# Patient Record
Sex: Female | Born: 1955 | Race: White | Hispanic: No | Marital: Married | Smoking: Never smoker
Health system: Southern US, Community
[De-identification: ages and names within clinical notes are randomized; demographics above are authoritative.]

## PROBLEM LIST (undated history)

## (undated) DIAGNOSIS — E079 Disorder of thyroid, unspecified: Secondary | ICD-10-CM

## (undated) DIAGNOSIS — R51 Headache: Secondary | ICD-10-CM

## (undated) DIAGNOSIS — D126 Benign neoplasm of colon, unspecified: Secondary | ICD-10-CM

## (undated) DIAGNOSIS — K649 Unspecified hemorrhoids: Secondary | ICD-10-CM

## (undated) DIAGNOSIS — F419 Anxiety disorder, unspecified: Secondary | ICD-10-CM

## (undated) DIAGNOSIS — H269 Unspecified cataract: Secondary | ICD-10-CM

## (undated) DIAGNOSIS — K589 Irritable bowel syndrome without diarrhea: Secondary | ICD-10-CM

## (undated) DIAGNOSIS — D649 Anemia, unspecified: Secondary | ICD-10-CM

## (undated) DIAGNOSIS — K449 Diaphragmatic hernia without obstruction or gangrene: Secondary | ICD-10-CM

## (undated) DIAGNOSIS — T7840XA Allergy, unspecified, initial encounter: Secondary | ICD-10-CM

## (undated) DIAGNOSIS — F329 Major depressive disorder, single episode, unspecified: Secondary | ICD-10-CM

## (undated) DIAGNOSIS — F32A Depression, unspecified: Secondary | ICD-10-CM

## (undated) DIAGNOSIS — M810 Age-related osteoporosis without current pathological fracture: Secondary | ICD-10-CM

## (undated) DIAGNOSIS — K648 Other hemorrhoids: Secondary | ICD-10-CM

## (undated) HISTORY — DX: Diaphragmatic hernia without obstruction or gangrene: K44.9

## (undated) HISTORY — DX: Benign neoplasm of colon, unspecified: D12.6

## (undated) HISTORY — DX: Depression, unspecified: F32.A

## (undated) HISTORY — PX: TONSILLECTOMY: SUR1361

## (undated) HISTORY — DX: Anxiety disorder, unspecified: F41.9

## (undated) HISTORY — DX: Unspecified cataract: H26.9

## (undated) HISTORY — PX: INGUINAL HERNIA REPAIR: SUR1180

## (undated) HISTORY — DX: Anemia, unspecified: D64.9

## (undated) HISTORY — DX: Unspecified hemorrhoids: K64.9

## (undated) HISTORY — DX: Major depressive disorder, single episode, unspecified: F32.9

## (undated) HISTORY — DX: Allergy, unspecified, initial encounter: T78.40XA

## (undated) HISTORY — DX: Irritable bowel syndrome, unspecified: K58.9

## (undated) HISTORY — DX: Disorder of thyroid, unspecified: E07.9

## (undated) HISTORY — PX: OTHER SURGICAL HISTORY: SHX169

## (undated) HISTORY — DX: Other hemorrhoids: K64.8

## (undated) HISTORY — PX: EYE SURGERY: SHX253

## (undated) HISTORY — DX: Age-related osteoporosis without current pathological fracture: M81.0

## (undated) HISTORY — PX: ABDOMINAL HYSTERECTOMY: SHX81

---

## 1998-02-06 ENCOUNTER — Ambulatory Visit (HOSPITAL_COMMUNITY): Admission: RE | Admit: 1998-02-06 | Discharge: 1998-02-06 | Payer: Self-pay | Admitting: Obstetrics & Gynecology

## 1998-10-16 ENCOUNTER — Other Ambulatory Visit: Admission: RE | Admit: 1998-10-16 | Discharge: 1998-10-16 | Payer: Self-pay | Admitting: Obstetrics & Gynecology

## 1999-01-01 ENCOUNTER — Ambulatory Visit (HOSPITAL_COMMUNITY): Admission: RE | Admit: 1999-01-01 | Discharge: 1999-01-01 | Payer: Self-pay | Admitting: Obstetrics & Gynecology

## 1999-01-01 ENCOUNTER — Encounter: Payer: Self-pay | Admitting: Obstetrics & Gynecology

## 1999-07-29 ENCOUNTER — Encounter: Payer: Self-pay | Admitting: Orthopedic Surgery

## 1999-07-29 ENCOUNTER — Ambulatory Visit (HOSPITAL_COMMUNITY): Admission: RE | Admit: 1999-07-29 | Discharge: 1999-07-29 | Payer: Self-pay | Admitting: Orthopedic Surgery

## 1999-10-29 ENCOUNTER — Other Ambulatory Visit: Admission: RE | Admit: 1999-10-29 | Discharge: 1999-10-29 | Payer: Self-pay | Admitting: Obstetrics & Gynecology

## 2000-01-03 ENCOUNTER — Encounter: Payer: Self-pay | Admitting: Obstetrics & Gynecology

## 2000-01-03 ENCOUNTER — Ambulatory Visit (HOSPITAL_COMMUNITY): Admission: RE | Admit: 2000-01-03 | Discharge: 2000-01-03 | Payer: Self-pay | Admitting: Obstetrics & Gynecology

## 2000-11-18 ENCOUNTER — Other Ambulatory Visit: Admission: RE | Admit: 2000-11-18 | Discharge: 2000-11-18 | Payer: Self-pay | Admitting: Obstetrics & Gynecology

## 2001-01-05 ENCOUNTER — Ambulatory Visit (HOSPITAL_COMMUNITY): Admission: RE | Admit: 2001-01-05 | Discharge: 2001-01-05 | Payer: Self-pay | Admitting: Obstetrics & Gynecology

## 2001-01-05 ENCOUNTER — Encounter: Payer: Self-pay | Admitting: Obstetrics & Gynecology

## 2001-12-10 ENCOUNTER — Other Ambulatory Visit: Admission: RE | Admit: 2001-12-10 | Discharge: 2001-12-10 | Payer: Self-pay | Admitting: Obstetrics & Gynecology

## 2002-01-17 ENCOUNTER — Encounter: Payer: Self-pay | Admitting: Obstetrics & Gynecology

## 2002-01-17 ENCOUNTER — Ambulatory Visit (HOSPITAL_COMMUNITY): Admission: RE | Admit: 2002-01-17 | Discharge: 2002-01-17 | Payer: Self-pay | Admitting: Obstetrics & Gynecology

## 2002-12-15 ENCOUNTER — Other Ambulatory Visit: Admission: RE | Admit: 2002-12-15 | Discharge: 2002-12-15 | Payer: Self-pay | Admitting: Obstetrics & Gynecology

## 2003-10-20 ENCOUNTER — Ambulatory Visit (HOSPITAL_COMMUNITY): Admission: RE | Admit: 2003-10-20 | Discharge: 2003-10-20 | Payer: Self-pay | Admitting: Cardiology

## 2004-01-31 ENCOUNTER — Other Ambulatory Visit: Admission: RE | Admit: 2004-01-31 | Discharge: 2004-01-31 | Payer: Self-pay | Admitting: Obstetrics & Gynecology

## 2005-02-18 ENCOUNTER — Other Ambulatory Visit: Admission: RE | Admit: 2005-02-18 | Discharge: 2005-02-18 | Payer: Self-pay | Admitting: Obstetrics & Gynecology

## 2007-11-05 ENCOUNTER — Ambulatory Visit: Payer: Self-pay | Admitting: Gastroenterology

## 2007-11-15 ENCOUNTER — Ambulatory Visit: Payer: Self-pay | Admitting: Gastroenterology

## 2007-11-15 DIAGNOSIS — K649 Unspecified hemorrhoids: Secondary | ICD-10-CM

## 2007-11-15 HISTORY — DX: Unspecified hemorrhoids: K64.9

## 2007-11-15 HISTORY — PX: COLONOSCOPY: SHX174

## 2007-12-09 ENCOUNTER — Encounter (INDEPENDENT_AMBULATORY_CARE_PROVIDER_SITE_OTHER): Payer: Self-pay | Admitting: Diagnostic Radiology

## 2007-12-09 ENCOUNTER — Encounter: Admission: RE | Admit: 2007-12-09 | Discharge: 2007-12-09 | Payer: Self-pay | Admitting: Obstetrics & Gynecology

## 2012-10-19 ENCOUNTER — Encounter (INDEPENDENT_AMBULATORY_CARE_PROVIDER_SITE_OTHER): Payer: Self-pay | Admitting: Surgery

## 2012-10-20 ENCOUNTER — Ambulatory Visit (INDEPENDENT_AMBULATORY_CARE_PROVIDER_SITE_OTHER): Payer: BC Managed Care – PPO | Admitting: Surgery

## 2012-10-20 ENCOUNTER — Encounter (INDEPENDENT_AMBULATORY_CARE_PROVIDER_SITE_OTHER): Payer: Self-pay | Admitting: Surgery

## 2012-10-20 VITALS — BP 108/68 | HR 64 | Temp 96.8°F | Resp 18 | Ht 61.0 in | Wt 149.0 lb

## 2012-10-20 DIAGNOSIS — K4090 Unilateral inguinal hernia, without obstruction or gangrene, not specified as recurrent: Secondary | ICD-10-CM | POA: Insufficient documentation

## 2012-10-20 NOTE — Progress Notes (Signed)
General Surgery [facility]  Chief Complaint  Patient presents with  . New Evaluation    eval poss hernia - referral from Dr. Konrad Dolores    HISTORY: The patient is a 57 year old white female referred by her gynecologist with left inguinal hernia. Patient had first noted symptoms approximately 3 weeks ago. She describes a dull ache in the left groin. She has noted mild soft tissue swelling in the region. She has performed manual reduction of a small hernia on occasion. She denies any signs or symptoms of obstruction. She has had no prior hernia repairs. She has undergone a laparoscopic assisted hysterectomy.she was evaluated by her gynecologist in late January 2014. She is now referred for repair of left inguinal hernia.  Past Medical History  Diagnosis Date  . Thyroid disease      Current Outpatient Prescriptions  Medication Sig Dispense Refill  . estradiol (VIVELLE-DOT) 0.05 MG/24HR Place 1 patch onto the skin once a week.      . levothyroxine (LEVOXYL) 25 MCG tablet Take 25 mcg by mouth daily.         Allergies  Allergen Reactions  . Benadryl (Diphenhydramine Hcl)   . Codeine      Family History  Problem Relation Age of Onset  . Cancer Mother      History   Social History  . Marital Status: Married    Spouse Name: N/A    Number of Children: N/A  . Years of Education: N/A   Social History Main Topics  . Smoking status: Never Smoker   . Smokeless tobacco: None  . Alcohol Use: Yes  . Drug Use: No  . Sexually Active:    Other Topics Concern  . None   Social History Narrative  . None     REVIEW OF SYSTEMS - PERTINENT POSITIVES ONLY: Denies signs or symptoms of obstruction. Always reducible.  EXAM: Filed Vitals:   10/20/12 0902  BP: 108/68  Pulse: 64  Temp: 96.8 F (36 C)  Resp: 18    HEENT: normocephalic; pupils equal and reactive; sclerae clear; dentition good; mucous membranes moist NECK:  symmetric on extension; no palpable  anterior or posterior cervical lymphadenopathy; no supraclavicular masses; no tenderness CHEST: clear to auscultation bilaterally without rales, rhonchi, or wheezes CARDIAC: regular rate and rhythm without significant murmur; peripheral pulses are full ABDOMEN: soft without distension; bowel sounds present; no mass; no hepatosplenomegaly; well-healed incision at the umbilicus. GU:  Palpation in the inguinal region with Valsalva and sit up maneuver shows a small likely indirect inguinal hernia on the left. There is no sign of hernia on the right. There is no sign of femoral hernia. There is no lymphadenopathy. EXT:  non-tender without edema; no deformity NEURO: no gross focal deficits; no sign of tremor   LABORATORY RESULTS: See [facility] (CHL-Epic) for most recent results   RADIOLOGY RESULTS: See [facility] (CHL-Epic) for most recent results   IMPRESSION: Left inguinal hernia, likely indirect, reducible  PLAN: I discussed operative repair of the left inguinal hernia with mesh with the patient at length. We discussed restrictions on her activities following the procedure. We discussed the risk of recurrence and infection. She understands and wishes to proceed. We will make arrangements for surgery as an outpatient in the near future.  The risks and benefits of the procedure have been discussed at length with the patient.  The patient understands the proposed procedure, potential alternative treatments, and the course of recovery to be expected.  All of the patient's questions have been answered at this time.  The patient wishes to proceed with surgery.  Velora Heckler, MD, FACS General & Endocrine Surgery [facility]   Visit Diagnoses: 1. Inguinal hernia unilateral, non-recurrent, left     Primary Care Physician: Primus Bravo, NP

## 2012-10-20 NOTE — Patient Instructions (Signed)
[facility]  HERNIA REPAIR POST OP INSTRUCTIONS  Always review your discharge instruction sheet given to you by the facility where your surgery was performed.  1. A  prescription for pain medication may be given to you upon discharge.  Take your pain medication as prescribed.  If narcotic pain medicine is not needed, then you may take acetaminophen (Tylenol) or ibuprofen (Advil) as needed.  2. Take your usually prescribed medications unless otherwise directed.  3. If you need a refill on your pain medication, please contact your pharmacy.  They will contact our office to request authorization. Prescriptions will not be filled after 5 pm daily or on weekends.  4. You should follow a light diet the first 24 hours after arrival home, such as soup and crackers or toast.  Be sure to include plenty of fluids daily.  Resume your normal diet the day after surgery.  5. Most patients will experience some swelling and bruising around the surgical site.  Ice packs and reclining will help.  Swelling and bruising can take several days to resolve.   6. It is common to experience some constipation if taking pain medication after surgery.  Increasing fluid intake and taking a stool softener (such as Colace) will usually help or prevent this problem from occurring.  A mild laxative (Milk of Magnesia or Miralax) should be taken according to package directions if there are no bowel movements after 48 hours.  7. Unless discharge instructions indicate otherwise, you may remove your bandages 24-48 hours after surgery, and you may shower at that time.  You may have steri-strips (small skin tapes) in place directly over the incision.  These strips should be left on the skin for 7-10 days.  If your surgeon used skin glue on the incision, you may shower in 24 hours.  The glue will flake off over the next 2-3 weeks.  Any sutures or staples will be removed at the office during your follow-up  visit.  8. ACTIVITIES:  You may resume regular (light) daily activities beginning the next day-such as daily self-care, walking, climbing stairs-gradually increasing activities as tolerated.  You may have sexual intercourse when it is comfortable.  Refrain from any heavy lifting or straining until approved by your doctor.  You may drive when you are no longer taking prescription pain medication, you can comfortably wear a seatbelt, and you can safely maneuver your car and apply brakes.  9. You should see your doctor in the office for a follow-up appointment approximately 2-3 weeks after your surgery.  Make sure that you call for this appointment within a day or two after you arrive home to insure a convenient appointment time. 10.   WHEN TO CALL YOUR DOCTOR: 1. Fever greater than 101.0 2. Inability to urinate 3. Persistent nausea and/or vomiting 4. Extreme swelling or bruising 5. Continued bleeding from incision 6. Increased pain, redness, or drainage from the incision  The clinic staff is available to answer your questions during regular business hours.  Please don't hesitate to call and ask to speak to one of the nurses for clinical concerns.  If you have a medical emergency, go to the nearest emergency room or call 911.  A surgeon from [facility] is always on call for the hospital.   [facility] [address], [address], [place], [place]  [address]  [phone] ? [phone] ? FAX [phone]  [email or web address]   

## 2012-11-11 DIAGNOSIS — K4090 Unilateral inguinal hernia, without obstruction or gangrene, not specified as recurrent: Secondary | ICD-10-CM

## 2012-11-12 ENCOUNTER — Telehealth (INDEPENDENT_AMBULATORY_CARE_PROVIDER_SITE_OTHER): Payer: Self-pay

## 2012-11-12 ENCOUNTER — Other Ambulatory Visit (INDEPENDENT_AMBULATORY_CARE_PROVIDER_SITE_OTHER): Payer: Self-pay

## 2012-11-12 DIAGNOSIS — R1900 Intra-abdominal and pelvic swelling, mass and lump, unspecified site: Secondary | ICD-10-CM

## 2012-11-12 NOTE — Telephone Encounter (Signed)
Message copied by Joanette Gula on Fri Nov 12, 2012  9:37 AM ------      Message from: Velora Heckler      Created: Thu Nov 11, 2012 11:27 AM       Surgery 2/27 at [facility]            Dx:  [facility]            Proc:  [facility] with mesh            Surg:  Colen Darling:      During procedure I noted an intra-abdominal mass.  She will need a CT scan of the abdomen in a few weeks.  Remind me to have this set up.      Thanks,      tmg            Velora Heckler, MD, [facility]      Office: [phone]             ------

## 2012-11-12 NOTE — Telephone Encounter (Signed)
Patient scheduled for po f/u 11/26/12 @ 2:00 patient is aware .and this appt was put in this slot per [facility]

## 2012-11-19 ENCOUNTER — Telehealth (INDEPENDENT_AMBULATORY_CARE_PROVIDER_SITE_OTHER): Payer: Self-pay | Admitting: General Surgery

## 2012-11-19 NOTE — Telephone Encounter (Signed)
Pt called for reassurance; is one week out from [facility].  Her family is "overly protective" and want her to sit all the time.  Advised pt to walk frequently and avoid pushing, pulling, lifting or carrying anything over 10 lbs for the first two weeks, then can go up to 20 lbs restriction.  Also add NSAIDs to her pain control regimen and limit narcotics AMAP.  She agrees and understands.

## 2012-11-25 ENCOUNTER — Ambulatory Visit
Admission: RE | Admit: 2012-11-25 | Discharge: 2012-11-25 | Disposition: A | Discharge status: Home / Self Care | Payer: BC Managed Care – PPO | Source: Ambulatory Visit | Attending: Surgery | Admitting: Surgery

## 2012-11-25 DIAGNOSIS — R1900 Intra-abdominal and pelvic swelling, mass and lump, unspecified site: Secondary | ICD-10-CM

## 2012-11-25 MED ORDER — IOHEXOL 300 MG/ML  SOLN
100.0000 mL | Freq: Once | INTRAMUSCULAR | Status: AC | PRN
  Administered 2012-11-25: 100 mL via INTRAVENOUS

## 2012-11-26 ENCOUNTER — Ambulatory Visit (INDEPENDENT_AMBULATORY_CARE_PROVIDER_SITE_OTHER): Payer: BC Managed Care – PPO | Admitting: Surgery

## 2012-11-26 ENCOUNTER — Encounter (INDEPENDENT_AMBULATORY_CARE_PROVIDER_SITE_OTHER): Payer: Self-pay | Admitting: Surgery

## 2012-11-26 VITALS — BP 104/62 | HR 70 | Resp 18 | Ht 60.0 in | Wt 148.0 lb

## 2012-11-26 DIAGNOSIS — R1900 Intra-abdominal and pelvic swelling, mass and lump, unspecified site: Secondary | ICD-10-CM

## 2012-11-26 DIAGNOSIS — K4090 Unilateral inguinal hernia, without obstruction or gangrene, not specified as recurrent: Secondary | ICD-10-CM

## 2012-11-26 NOTE — Patient Instructions (Signed)
  COCOA BUTTER & VITAMIN E CREAM  ([employer] or other brand)  Apply cocoa butter/vitamin E cream to your incision 2 - 3 times daily.  Massage cream into incision for one minute with each application.  Use sunscreen (50 SPF or higher) for first 6 months after surgery if area is exposed to sun.  You may substitute Mederma or other scar reducing creams as desired.   

## 2012-11-26 NOTE — Progress Notes (Signed)
General Surgery [facility]  Visit Diagnoses: 1. Inguinal hernia unilateral, non-recurrent, left   2. Mass in the abdomen     HISTORY: Patient is a 57 year old white female who underwent left inguinal hernia repair in late February 2014. During her procedure she was noted to have an abdominal mass. As followup we obtained a CT scan of the abdomen. 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 The left ovary has a 2.2 cm cyst. No other significant findings were identified on CT scan.  Patient has done well following her hernia repair. She has had the normal amount of discomfort which is improving. She is slowly increasing her level of activity.  EXAM: Surgical incision in the left groin is well healed. Steri-Strips are removed. No sign of infection. With Valsalva and sit up maneuver there is no sign of recurrence.  IMPRESSION: #1 status post left inguinal hernia repair with mesh #2 intra-abdominal mass, 16 cm, likely arising from right ovary  PLAN: The patient and I discussed the CT scan results. I provided her with a copy of the CT scan report. She will see Dr. Konrad Dolores in follow-up. She may need surgical resection.  Patient will begin applying topical creams to her incision. She will return in 6 weeks for a final wound check.  Velora Heckler, MD, FACS General & Endocrine Surgery [facility]

## 2012-11-30 ENCOUNTER — Telehealth (INDEPENDENT_AMBULATORY_CARE_PROVIDER_SITE_OTHER): Payer: Self-pay

## 2012-11-30 NOTE — Telephone Encounter (Signed)
I have sent msg to Dr Gerrit Friends to review CT report and advise pt f/u.

## 2012-12-02 ENCOUNTER — Encounter: Payer: Self-pay | Admitting: Gastroenterology

## 2012-12-12 ENCOUNTER — Observation Stay (HOSPITAL_COMMUNITY)
Admission: AD | Admit: 2012-12-12 | Discharge: 2012-12-15 | Disposition: A | Discharge status: Home / Self Care | Payer: BC Managed Care – PPO | Source: Other Acute Inpatient Hospital | Attending: Obstetrics and Gynecology | Admitting: Obstetrics and Gynecology

## 2012-12-12 DIAGNOSIS — N8353 Torsion of ovary, ovarian pedicle and fallopian tube: Secondary | ICD-10-CM | POA: Insufficient documentation

## 2012-12-12 DIAGNOSIS — R109 Unspecified abdominal pain: Secondary | ICD-10-CM | POA: Insufficient documentation

## 2012-12-12 DIAGNOSIS — R1900 Intra-abdominal and pelvic swelling, mass and lump, unspecified site: Secondary | ICD-10-CM

## 2012-12-12 DIAGNOSIS — N83209 Unspecified ovarian cyst, unspecified side: Principal | ICD-10-CM | POA: Insufficient documentation

## 2012-12-12 DIAGNOSIS — Z90710 Acquired absence of both cervix and uterus: Secondary | ICD-10-CM | POA: Insufficient documentation

## 2012-12-12 DIAGNOSIS — N949 Unspecified condition associated with female genital organs and menstrual cycle: Secondary | ICD-10-CM | POA: Insufficient documentation

## 2012-12-12 HISTORY — DX: Headache: R51

## 2012-12-12 MED ORDER — ONDANSETRON HCL 4 MG/2ML IJ SOLN
4.0000 mg | Freq: Four times a day (QID) | INTRAMUSCULAR | Status: DC | PRN
Start: 2012-12-12 — End: 2012-12-12
  Filled 2012-12-12: qty 2

## 2012-12-12 MED ORDER — POLYETHYLENE GLYCOL 3350 17 G PO PACK
17.0000 g | PACK | Freq: Every day | ORAL | Status: DC | PRN
  Administered 2012-12-12: 17 g via ORAL
  Filled 2012-12-12: qty 1

## 2012-12-12 MED ORDER — DIPHENHYDRAMINE HCL 12.5 MG/5ML PO ELIX: 12.5000 mg | ORAL_SOLUTION | Freq: Four times a day (QID) | ORAL | Status: DC | PRN

## 2012-12-12 MED ORDER — NALOXONE HCL 0.4 MG/ML IJ SOLN: 0.4000 mg | INTRAMUSCULAR | Status: DC | PRN

## 2012-12-12 MED ORDER — LACTATED RINGERS IV SOLN
INTRAVENOUS | Status: DC
  Administered 2012-12-12 – 2012-12-14 (×7): via INTRAVENOUS

## 2012-12-12 MED ORDER — DIPHENHYDRAMINE HCL 50 MG/ML IJ SOLN: 12.5000 mg | Freq: Four times a day (QID) | INTRAMUSCULAR | Status: DC | PRN

## 2012-12-12 MED ORDER — ONDANSETRON HCL 4 MG/2ML IJ SOLN
4.0000 mg | Freq: Four times a day (QID) | INTRAMUSCULAR | Status: DC | PRN
  Administered 2012-12-12 (×2): 4 mg via INTRAVENOUS
  Filled 2012-12-12: qty 2

## 2012-12-12 MED ORDER — ONDANSETRON 8 MG/NS 50 ML IVPB
8.0000 mg | Freq: Three times a day (TID) | INTRAVENOUS | Status: DC | PRN
  Administered 2012-12-12 – 2012-12-13 (×3): 8 mg via INTRAVENOUS
  Filled 2012-12-12 (×3): qty 8

## 2012-12-12 MED ORDER — MORPHINE SULFATE (PF) 1 MG/ML IV SOLN
INTRAVENOUS | Status: DC
  Administered 2012-12-12: 3 mg via INTRAVENOUS
  Administered 2012-12-12 (×2): via INTRAVENOUS
  Filled 2012-12-12 (×2): qty 25

## 2012-12-12 MED ORDER — SODIUM CHLORIDE 0.9 % IJ SOLN: 9.0000 mL | INTRAMUSCULAR | Status: DC | PRN

## 2012-12-12 NOTE — Progress Notes (Signed)
Patient ID: [name] Molina, female   DOB: July 20, 1956, 57 y.o.   MRN: [id number]   S//  Feeling better on MS PCA, wants to try to eat  O//  BP 88/56  Pulse 79  Temp(Src) 98.3 F (36.8 C) (Oral)  Resp 20  SpO2 99%  Abd soft +BS, no rebound  A+P//  lg ov cyst/abd pain, will cont PCA, keep NPO after MN, notify Dr Jennette Kettle in am to review surgical plan

## 2012-12-12 NOTE — Progress Notes (Signed)
Patient ID: [name] Molina, female   DOB: 1956-03-05, 57 y.o.   MRN: [id number]  Chief complaint: Abdominal pain  History of present illness: 57 year old has had a previous hysterectomy, she is a patient of Dr. Donnetta Hail, was noted in recent months at the time of inguinal hernia repair by Dr. Georgana Curio to have a ovarian mass. 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 CA 125 was drawn which returned normal. She was scheduled to see Dr. Barbara Cower, and GYN oncology, in mid April.  Presented to the [facility] last evening with complaints of increased abdominal pain. I spoke with the ED physician there, the existing CT and ultrasound reports were relayed to them, she was given IV narcotics, repeat imaging studies were shown to rule out acute portion. There was blood flow to the cyst but too to pain, she was transferred here for pain and further management.  Physical exam:  BP 95/60  Pulse 60  Temp(Src) 97.8 F (36.6 C) (Oral)  Resp 20  SpO2 99% HEENT: Unremarkable Lungs: Clear  Cardiovascular: Regular rate and rhythm without murmurs rubs gallops noted.  Breasts: Not examined  Abdomen: Soft, positive bowel sounds, there is a moderate lower quadrant distention with pain to deeper palpation.  Pelvic examination: Deferred  Impression: Large cystic ovarian mass, abdominal pain  Plan: will l admit for pain management, outline plan for further surgical management.  Adem Costlow M. Marcelle Overlie M.D. in

## 2012-12-13 ENCOUNTER — Encounter (HOSPITAL_COMMUNITY): Payer: Self-pay | Admitting: *Deleted

## 2012-12-13 LAB — CBC
HCT: 30 % — ABNORMAL LOW (ref 36.0–46.0)
MCH: 29.4 pg (ref 26.0–34.0)
MCHC: 32.3 g/dL (ref 30.0–36.0)
MCV: 90.9 fL (ref 78.0–100.0)
Platelets: 114 10*3/uL — ABNORMAL LOW (ref 150–400)
RDW: 14.3 % (ref 11.5–15.5)
WBC: 10.8 10*3/uL — ABNORMAL HIGH (ref 4.0–10.5)

## 2012-12-13 MED ORDER — ONDANSETRON HCL 4 MG/2ML IJ SOLN: 4.0000 mg | Freq: Four times a day (QID) | INTRAMUSCULAR | Status: DC | PRN

## 2012-12-13 MED ORDER — SODIUM CHLORIDE 0.9 % IJ SOLN: 9.0000 mL | INTRAMUSCULAR | Status: DC | PRN

## 2012-12-13 MED ORDER — DIPHENHYDRAMINE HCL 50 MG/ML IJ SOLN: 12.5000 mg | Freq: Four times a day (QID) | INTRAMUSCULAR | Status: DC | PRN

## 2012-12-13 MED ORDER — DIPHENHYDRAMINE HCL 12.5 MG/5ML PO ELIX: 12.5000 mg | ORAL_SOLUTION | Freq: Four times a day (QID) | ORAL | Status: DC | PRN

## 2012-12-13 MED ORDER — HYDROMORPHONE 0.3 MG/ML IV SOLN
INTRAVENOUS | Status: DC
  Administered 2012-12-13: 1 mg via INTRAVENOUS
  Administered 2012-12-13: 5.5 mg via INTRAVENOUS
  Administered 2012-12-13: 0.9 mg via INTRAVENOUS
  Administered 2012-12-13: 11:00:00 via INTRAVENOUS
  Administered 2012-12-14: 0.6 mg via INTRAVENOUS
  Administered 2012-12-14: 1.2 mg via INTRAVENOUS
  Filled 2012-12-13: qty 25

## 2012-12-13 MED ORDER — SUMATRIPTAN SUCCINATE 6 MG/0.5ML ~~LOC~~ SOLN
6.0000 mg | Freq: Once | SUBCUTANEOUS | Status: AC
  Administered 2012-12-13: 6 mg via SUBCUTANEOUS
  Filled 2012-12-13: qty 0.5

## 2012-12-13 MED ORDER — NALOXONE HCL 0.4 MG/ML IJ SOLN: 0.4000 mg | INTRAMUSCULAR | Status: DC | PRN

## 2012-12-13 NOTE — Progress Notes (Signed)
Ur chart review completed.  

## 2012-12-13 NOTE — Progress Notes (Signed)
Subjective: Patient reports nausea.   No vomiting.  Abd and headache Objective: I have reviewed patient's vital signs and labs.  General: alert, cooperative and moderate distress BS hypoactive, No rebound   Assessment/Plan: Abd pain, cystic mass Will switch PCA to dilaudid and see if less nausea and better pain relief than morphine. Dr Jennette Kettle will review her case and see her today to discuss plan of care   LOS: 1 day    Gage Treiber C 12/13/2012, 9:54 AM

## 2012-12-14 ENCOUNTER — Encounter (HOSPITAL_COMMUNITY): Payer: Self-pay | Admitting: Anesthesiology

## 2012-12-14 ENCOUNTER — Encounter (HOSPITAL_COMMUNITY)
Admission: AD | Disposition: A | Discharge status: Home / Self Care | Payer: Self-pay | Source: Other Acute Inpatient Hospital | Attending: Obstetrics and Gynecology

## 2012-12-14 ENCOUNTER — Inpatient Hospital Stay (HOSPITAL_COMMUNITY): Payer: BC Managed Care – PPO | Admitting: Anesthesiology

## 2012-12-14 HISTORY — PX: LAPAROSCOPY: SHX197

## 2012-12-14 LAB — URINALYSIS, ROUTINE W REFLEX MICROSCOPIC
Bilirubin Urine: NEGATIVE
Glucose, UA: NEGATIVE mg/dL
Ketones, ur: NEGATIVE mg/dL
Leukocytes, UA: NEGATIVE
Protein, ur: NEGATIVE mg/dL

## 2012-12-14 SURGERY — LAPAROSCOPY OPERATIVE
Anesthesia: General | Wound class: Clean Contaminated

## 2012-12-14 MED ORDER — BUPIVACAINE HCL (PF) 0.25 % IJ SOLN
INTRAMUSCULAR | Status: AC
  Filled 2012-12-14: qty 30

## 2012-12-14 MED ORDER — NALBUPHINE SYRINGE 5 MG/0.5 ML
INJECTION | INTRAMUSCULAR | Status: AC
  Administered 2012-12-14: 5 mg via SUBCUTANEOUS
  Filled 2012-12-14: qty 0.5

## 2012-12-14 MED ORDER — OXYCODONE-ACETAMINOPHEN 5-325 MG PO TABS: 1.0000 | ORAL_TABLET | ORAL | Status: DC | PRN

## 2012-12-14 MED ORDER — ROCURONIUM BROMIDE 100 MG/10ML IV SOLN
INTRAVENOUS | Status: DC | PRN
  Administered 2012-12-14: 5 mg via INTRAVENOUS
  Administered 2012-12-14 (×2): 10 mg via INTRAVENOUS
  Administered 2012-12-14: 30 mg via INTRAVENOUS

## 2012-12-14 MED ORDER — MENTHOL 3 MG MT LOZG: 1.0000 | LOZENGE | OROMUCOSAL | Status: DC | PRN

## 2012-12-14 MED ORDER — FENTANYL CITRATE 0.05 MG/ML IJ SOLN
INTRAMUSCULAR | Status: AC
  Filled 2012-12-14: qty 5

## 2012-12-14 MED ORDER — ACETAMINOPHEN 325 MG PO TABS
650.0000 mg | ORAL_TABLET | Freq: Four times a day (QID) | ORAL | Status: DC | PRN
  Administered 2012-12-14: 650 mg via ORAL
  Filled 2012-12-14: qty 2

## 2012-12-14 MED ORDER — ONDANSETRON HCL 4 MG PO TABS: 4.0000 mg | ORAL_TABLET | Freq: Four times a day (QID) | ORAL | Status: DC | PRN

## 2012-12-14 MED ORDER — NALBUPHINE SYRINGE 5 MG/0.5 ML
5.0000 mg | INJECTION | Freq: Once | INTRAMUSCULAR | Status: AC
  Filled 2012-12-14: qty 0.5

## 2012-12-14 MED ORDER — MIDAZOLAM HCL 5 MG/5ML IJ SOLN
INTRAMUSCULAR | Status: DC | PRN
  Administered 2012-12-14: 2 mg via INTRAVENOUS

## 2012-12-14 MED ORDER — DEXAMETHASONE SODIUM PHOSPHATE 10 MG/ML IJ SOLN
INTRAMUSCULAR | Status: AC
  Filled 2012-12-14: qty 1

## 2012-12-14 MED ORDER — LIDOCAINE HCL (CARDIAC) 20 MG/ML IV SOLN
INTRAVENOUS | Status: AC
  Filled 2012-12-14: qty 5

## 2012-12-14 MED ORDER — NEOSTIGMINE METHYLSULFATE 1 MG/ML IJ SOLN
INTRAMUSCULAR | Status: AC
  Filled 2012-12-14: qty 1

## 2012-12-14 MED ORDER — ONDANSETRON HCL 4 MG/2ML IJ SOLN
INTRAMUSCULAR | Status: DC | PRN
  Administered 2012-12-14: 4 mg via INTRAVENOUS

## 2012-12-14 MED ORDER — LEVOTHYROXINE SODIUM 88 MCG PO TABS
88.0000 ug | ORAL_TABLET | Freq: Every day | ORAL | Status: DC
  Filled 2012-12-14: qty 1

## 2012-12-14 MED ORDER — HYDROMORPHONE HCL PF 1 MG/ML IJ SOLN
INTRAMUSCULAR | Status: AC
  Filled 2012-12-14: qty 1

## 2012-12-14 MED ORDER — HYDROMORPHONE HCL PF 1 MG/ML IJ SOLN
INTRAMUSCULAR | Status: DC | PRN
  Administered 2012-12-14: .5 mg via INTRAVENOUS
  Administered 2012-12-14: 0.5 mg via INTRAVENOUS

## 2012-12-14 MED ORDER — PROPOFOL 10 MG/ML IV EMUL
INTRAVENOUS | Status: AC
  Filled 2012-12-14: qty 20

## 2012-12-14 MED ORDER — ONDANSETRON HCL 4 MG/2ML IJ SOLN: 4.0000 mg | Freq: Four times a day (QID) | INTRAMUSCULAR | Status: DC | PRN

## 2012-12-14 MED ORDER — LIDOCAINE HCL (CARDIAC) 20 MG/ML IV SOLN
INTRAVENOUS | Status: DC | PRN
  Administered 2012-12-14: 60 mg via INTRAVENOUS

## 2012-12-14 MED ORDER — PROPOFOL 10 MG/ML IV EMUL
INTRAVENOUS | Status: DC | PRN
  Administered 2012-12-14: 150 mg via INTRAVENOUS

## 2012-12-14 MED ORDER — GLYCOPYRROLATE 0.2 MG/ML IJ SOLN
INTRAMUSCULAR | Status: DC | PRN
  Administered 2012-12-14: .4 mg via INTRAVENOUS

## 2012-12-14 MED ORDER — FENTANYL CITRATE 0.05 MG/ML IJ SOLN
INTRAMUSCULAR | Status: DC | PRN
  Administered 2012-12-14 (×4): 50 ug via INTRAVENOUS

## 2012-12-14 MED ORDER — DEXAMETHASONE SODIUM PHOSPHATE 4 MG/ML IJ SOLN
INTRAMUSCULAR | Status: DC | PRN
  Administered 2012-12-14: 6 mg via INTRAVENOUS

## 2012-12-14 MED ORDER — MIDAZOLAM HCL 2 MG/2ML IJ SOLN
INTRAMUSCULAR | Status: AC
  Filled 2012-12-14: qty 2

## 2012-12-14 MED ORDER — NEOSTIGMINE METHYLSULFATE 1 MG/ML IJ SOLN
INTRAMUSCULAR | Status: DC | PRN
  Administered 2012-12-14: 3 mg via INTRAVENOUS

## 2012-12-14 MED ORDER — GLYCOPYRROLATE 0.2 MG/ML IJ SOLN
INTRAMUSCULAR | Status: AC
  Filled 2012-12-14: qty 2

## 2012-12-14 MED ORDER — BUPIVACAINE HCL (PF) 0.25 % IJ SOLN
INTRAMUSCULAR | Status: DC | PRN
  Administered 2012-12-14: 7 mL

## 2012-12-14 MED ORDER — HYDROMORPHONE HCL PF 1 MG/ML IJ SOLN: 0.2500 mg | INTRAMUSCULAR | Status: DC | PRN

## 2012-12-14 MED ORDER — IBUPROFEN 800 MG PO TABS: 800.0000 mg | ORAL_TABLET | Freq: Three times a day (TID) | ORAL | Status: DC | PRN

## 2012-12-14 MED ORDER — ROCURONIUM BROMIDE 50 MG/5ML IV SOLN
INTRAVENOUS | Status: AC
  Filled 2012-12-14: qty 1

## 2012-12-14 MED ORDER — ONDANSETRON HCL 4 MG/2ML IJ SOLN
INTRAMUSCULAR | Status: AC
  Filled 2012-12-14: qty 2

## 2012-12-14 SURGICAL SUPPLY — 56 items
BARRIER ADHS 3X4 INTERCEED (GAUZE/BANDAGES/DRESSINGS) IMPLANT
BLADE SURG 15 STRL LF C SS BP (BLADE) ×4 IMPLANT
BLADE SURG 15 STRL SS (BLADE) ×2
CABLE HIGH FREQUENCY MONO STRZ (ELECTRODE) IMPLANT
CANISTER SUCTION 2500CC (MISCELLANEOUS) ×3 IMPLANT
CATH ROBINSON RED A/P 16FR (CATHETERS) ×3 IMPLANT
CLOTH BEACON ORANGE TIMEOUT ST (SAFETY) ×3 IMPLANT
CONT PATH 16OZ SNAP LID 3702 (MISCELLANEOUS) IMPLANT
CONT SPECI 4OZ STER CLIK (MISCELLANEOUS) ×3 IMPLANT
DECANTER SPIKE VIAL GLASS SM (MISCELLANEOUS) IMPLANT
DISSECTOR SPONGE CHERRY (GAUZE/BANDAGES/DRESSINGS) IMPLANT
DRAIN FLAT 3/4 PER (DRAIN) IMPLANT
DRAIN JACKSON PRT FLT 7MM (DRAIN) IMPLANT
ELECT BLADE 6 FLAT ULTRCLN (ELECTRODE) IMPLANT
ELECT LIGASURE LONG (ELECTRODE) IMPLANT
FORCEPS CUTTING 33CM 5MM (CUTTING FORCEPS) IMPLANT
GLOVE BIO SURGEON STRL SZ7.5 (GLOVE) ×3 IMPLANT
GOWN PREVENTION PLUS LG XLONG (DISPOSABLE) ×6 IMPLANT
GOWN PREVENTION PLUS XLARGE (GOWN DISPOSABLE) ×3 IMPLANT
HEMOCLIPS LARGE (CLIP) IMPLANT
LOOP VESSEL MINI RED (MISCELLANEOUS) IMPLANT
NS IRRIG 1000ML POUR BTL (IV SOLUTION) ×3 IMPLANT
PACK ABDOMINAL GYN (CUSTOM PROCEDURE TRAY) ×3 IMPLANT
PACK LAPAROSCOPY BASIN (CUSTOM PROCEDURE TRAY) IMPLANT
PACK LAVH (CUSTOM PROCEDURE TRAY) ×3 IMPLANT
PAD OB MATERNITY 4.3X12.25 (PERSONAL CARE ITEMS) ×3 IMPLANT
POUCH SPECIMEN RETRIEVAL 10MM (ENDOMECHANICALS) ×3 IMPLANT
PROTECTOR NERVE ULNAR (MISCELLANEOUS) ×3 IMPLANT
SEALER TISSUE G2 CVD JAW 45CM (ENDOMECHANICALS) ×3 IMPLANT
SET IRRIG TUBING LAPAROSCOPIC (IRRIGATION / IRRIGATOR) ×3 IMPLANT
SPONGE LAP 18X18 X RAY DECT (DISPOSABLE) ×6 IMPLANT
STRIP CLOSURE SKIN 1/4X3 (GAUZE/BANDAGES/DRESSINGS) ×3 IMPLANT
STRIP CLOSURE SKIN 1/4X4 (GAUZE/BANDAGES/DRESSINGS) ×3 IMPLANT
SUT CHROMIC 3 0 SH 27 (SUTURE) IMPLANT
SUT CHROMIC 3 0 TIES (SUTURE) IMPLANT
SUT MNCRL 0 MO-4 VIOLET 18 CR (SUTURE) ×6 IMPLANT
SUT MNCRL 0 VIOLET 6X18 (SUTURE) ×2 IMPLANT
SUT MNCRL AB 0 CT1 27 (SUTURE) ×3 IMPLANT
SUT MON AB 2-0 CT1 27 (SUTURE) IMPLANT
SUT MON AB 3-0 SH 27 (SUTURE)
SUT MON AB 3-0 SH27 (SUTURE) IMPLANT
SUT MONOCRYL 0 6X18 (SUTURE) ×1
SUT MONOCRYL 0 MO 4 18  CR/8 (SUTURE) ×3
SUT PDS AB 0 CT1 27 (SUTURE) ×3 IMPLANT
SUT PLAIN 2 0 XLH (SUTURE) IMPLANT
SUT PROLENE 3 0 FS 2 (SUTURE) IMPLANT
SUT VIC AB 3-0 PS2 18 (SUTURE) ×1
SUT VIC AB 3-0 PS2 18XBRD (SUTURE) ×2 IMPLANT
SUT VICRYL 0 ENDOLOOP (SUTURE) IMPLANT
SUT VICRYL 0 UR6 27IN ABS (SUTURE) IMPLANT
TOWEL OR 17X24 6PK STRL BLUE (TOWEL DISPOSABLE) ×6 IMPLANT
TRAY FOLEY CATH 14FR (SET/KITS/TRAYS/PACK) ×3 IMPLANT
TROCAR OPTI TIP 5M 100M (ENDOMECHANICALS) ×3 IMPLANT
TROCAR XCEL DIL TIP R 11M (ENDOMECHANICALS) ×3 IMPLANT
WARMER LAPAROSCOPE (MISCELLANEOUS) ×3 IMPLANT
WATER STERILE IRR 1000ML POUR (IV SOLUTION) ×3 IMPLANT

## 2012-12-14 NOTE — Transfer of Care (Incomplete)
Immediate Anesthesia Transfer of Care Note  Patient: [name] Molina  Procedure(s) Performed: Procedure(s) with comments: LAPAROSCOPY OPERATIVE (N/A) - with Bilateral Ovarian Cystectomies OOPHORECTOMY (Bilateral) - Bilateral Oophorectomies  Patient Location: {PLACES; ANE POST:19477::"PACU"}  Anesthesia Type:{PROCEDURES; ANE POST ANESTHESIA TYPE:19480}  Level of Consciousness: {FINDINGS; ANE POST LEVEL OF CONSCIOUSNESS:19484}  Airway & Oxygen Therapy: {Exam; oxygen device:30095}  Post-op Assessment: {ASSESSMENT;POST-OP ZOXWRU:04540}  Post vital signs: {DESC; ANE POST JWJXBJ:47829}  Complications: {FINDINGS; ANE POST COMPLICATIONS:19485}

## 2012-12-14 NOTE — Progress Notes (Addendum)
Patient asked if her temperature could be checked because she felt "warm". Temp was 99.9. No order for tylenol. Dr. Renaldo Fiddler paged with orders for tylenol 650mg  Q6H PRN fever,  orders to collect UA, and to call if temperatures continue to increase. Orders also given for Motrin 800mg  Q8H PRN for mild-moderate pain. Will continue to monitor.

## 2012-12-14 NOTE — Anesthesia Preprocedure Evaluation (Signed)

## 2012-12-14 NOTE — Anesthesia Postprocedure Evaluation (Signed)
  Anesthesia Post-op Note  Anesthesia Post Note  Patient: [name] Molina  Procedure(s) Performed: Procedure(s) (LRB): LAPAROSCOPY OPERATIVE (N/A) OOPHORECTOMY (Bilateral)  Anesthesia type: General  Patient location: PACU  Post pain: Pain level controlled  Post assessment: Post-op Vital signs reviewed  Last Vitals:  Filed Vitals:   12/14/12 0945  BP: 110/64  Pulse: 82  Temp:   Resp: 20    Post vital signs: Reviewed  Level of consciousness: sedated  Complications: No apparent anesthesia complications

## 2012-12-14 NOTE — Op Note (Signed)
NAMEISMAEL, TREPTOW NO.:  [id number]  MEDICAL RECORD NO.:  [id number]  LOCATION:  9305                          FACILITY:  [facility]  PHYSICIAN:  Freddy Finner, M.D.   DATE OF BIRTH:  [birth year]  DATE OF PROCEDURE:  12/13/2012 DATE OF DISCHARGE:                              OPERATIVE REPORT   This is a daily progress note on [name] Molina who was admitted on December 11, 2012, by Dr. Marcelle Overlie with abdominal pain and a known pelvic abdominal mass.  Details of the present illness were recorded in his note, but briefly, the patient had a surgical procedure within the last 2-3 months for inguinal hernia with Dr. Georgana Curio, and was subsequently found on CT to have a large cystic mass, presumably ovarian and by the CT findings consistent with a benign lesion even though it was very large.  Tumor markers have been obtained, which were negative, and ultrasound in my office re-confirmed the above findings.  There was nothing else significantly abnormal on the CT scan.  The patient was asymptomatic initially and was scheduled to see Dr. __________ next month in April for consideration of laparoscopy, possible laparotomy. Because of her recent onset of symptoms, she is admitted to the hospital for pain management, and today on my examination, the patient was alert, oriented.  She was having some pain.  She was mildly tender over the mass in her abdomen.  She had no peritoneal signs.  Her vital signs have been stable.  She has remained afebrile and Dilaudid for pain, which was substituted for Morphine.  Her nausea has been improved.  She did complain of a migraine headache.  For this, she was given 6 mg of Imitrex subcu early this evening.  ASSESSMENT AND PLAN:  The patient and I have discussed at length surgical opportunities for her mass.  Because of the likelihood that it is partial torsion causing her pain, we have planned a surgical procedure in the morning, and it is posted  as laparoscopy, possible exploratory laparotomy.  In consultation with Dr. __________ this option was discussed.  She is not available for surgery at this time.  I have discussed at length with the patient the possibility, which is very small of a malignancy in which case rupturing the mass would be possibly related to additional therapy.  The magnitude of the surgical procedure done laparoscopically is dramatically better for the patient in terms of postoperative recovery, and the plan that she and I have arrived on is to do the laparoscopy, take peritoneal washings, and if things appear to be completely benign, I will decompressive the mass and then remove both tubes and ovaries.  In the event that this is not so, then an exploratory laparotomy will be performed in which case an incision would be made intraoperatively of either vertical or transverse.  The patient seems to be prepared and ready to proceed with surgery, which is planned for 07:30 in the morning.     Freddy Finner, M.D.     WRN/MEDQ  D:  12/13/2012  T:  12/14/2012  Job:  [id number]

## 2012-12-14 NOTE — Anesthesia Postprocedure Evaluation (Signed)
Anesthesia Post Note  Patient: [name] Molina  Procedure(s) Performed: Procedure(s) (LRB): LAPAROSCOPY OPERATIVE (N/A) OOPHORECTOMY (Bilateral)  Anesthesia type: General  Patient location: Women's Unit  Post pain: Pain level controlled  Post assessment: Post-op Vital signs reviewed  Last Vitals:  Filed Vitals:   12/14/12 1500  BP: 114/78  Pulse: 78  Temp:   Resp: 20    Post vital signs: Reviewed  Level of consciousness: sedated  Complications: No apparent anesthesia complications

## 2012-12-14 NOTE — Transfer of Care (Signed)
Immediate Anesthesia Transfer of Care Note  Patient: [name] Molina  Procedure(s) Performed: Procedure(s) with comments: LAPAROSCOPY OPERATIVE (N/A) - with Bilateral Ovarian Cystectomies OOPHORECTOMY (Bilateral) - Bilateral Oophorectomies  Patient Location: PACU  Anesthesia Type:General  Level of Consciousness: awake, alert  and oriented  Airway & Oxygen Therapy: Patient Spontanous Breathing and Patient connected to nasal cannula oxygen  Post-op Assessment: Report given to PACU RN and Post -op Vital signs reviewed and stable  Post vital signs: stable  Complications: No apparent anesthesia complications

## 2012-12-15 ENCOUNTER — Encounter (HOSPITAL_COMMUNITY): Payer: Self-pay | Admitting: Obstetrics & Gynecology

## 2012-12-15 LAB — CBC
Hemoglobin: 9.4 g/dL — ABNORMAL LOW (ref 12.0–15.0)
MCH: 29.4 pg (ref 26.0–34.0)
MCHC: 32.6 g/dL (ref 30.0–36.0)

## 2012-12-15 MED FILL — Heparin Sodium (Porcine) Inj 5000 Unit/ML: INTRAMUSCULAR | Qty: 1 | Status: AC

## 2012-12-15 NOTE — Op Note (Signed)
[name], [name] NO.:  [id number]  MEDICAL RECORD NO.:  [id number]  LOCATION:  9305                          FACILITY:  [facility]  PHYSICIAN:  Freddy Finner, M.D.   DATE OF BIRTH:  May 27, 1956  DATE OF PROCEDURE:  12/14/2012 DATE OF DISCHARGE:                              OPERATIVE REPORT   PREOPERATIVE DIAGNOSIS:  Large cystic, pelvic, and lower abdominal mass, suspected partial or complete ovarian torsion.  POSTOPERATIVE DIAGNOSIS:  Large cystic, pelvic, and lower abdominal mass, suspected partial or complete ovarian torsion with secondary finding of enlargement of left ovary and intraoperative frozen section diagnosis of a benign ovarian cyst.  SURGICAL PROCEDURE:  Laparoscopy, drainage of large pelvic ovarian cyst, peritoneal washings, collection of cyst fluid for analysis, right salpingo-oophorectomy including a large clotted and necrotic-looking portion of the ovary, and left salpingo-oophorectomy.  SURGEON:  Freddy Finner, M.D.  ANESTHESIA:  General endotracheal.  ESTIMATED INTRAOPERATIVE BLOOD LOSS:  150 mL.  INTRAOPERATIVE COMPLICATIONS:  None.  Details of the present illness is recorded in the admission note.  The patient had the onset of her symptoms on the 28th when she first noted abdominal pain that progressively worsened to the point of severe pain on Saturday, which is the 29th.  She was taken by ambulance to hospital in [place], later transported to [facility] where she was admitted by Dr. Richarda Overlie on Saturday afternoon or evening and treated with pain management.  On my examination on the 31st, she did not have peritoneal signs.  She did have abdominal tenderness.  Her pain had been adequately controlled as well as her nausea and vomiting.  She was scheduled for surgery on the morning of this dictation which is April 1st.  She was brought to the operating room, there placed under adequate general endotracheal anesthesia,  placed in dorsal lithotomy position using the [employer] system.  The abdominal prep was carried out according to protocol.  The mons and perineum was cleansed with Betadine, and the urethral meatus was cleansed, and the Foley catheter was placed.  Sterile drapes were then applied.  Initially, a small umbilical incision was made as well as a small incision approximately 1.5 cm to 2 cm above the symphysis in the midline.  Through the upper incision, a Veress needle was introduced.  Prompt return of serosanguineous fluid was obtained.  Attempts were made to aspirate the fluid with wall suction, and later a syringe was used to collect the fluid and send for cytologic examination.  The laparoscope was placed after pneumoperitoneum was allowed to accumulate with carbon dioxide gas, and inspection revealed a large cystic shotty whitish mass filling the viewing with laparoscope.  With further inspection and with a 2nd 5 mm trocar at the lower position for placement of a blunt probe for traction and inspection revealed findings as noted in postoperative diagnosis with what appeared to be an otherwise benign cyst with serosanguineous fluid.  There was a large clot presumed to include the ovary and overall measurement was probably about 10 x 6 x 4 cm.  A rent was torn in the ovary, which allowed reduction of the mass by aspirating the fluid, and the  initial probe was placed through a 3rd incision in the midline approximately midway between the other two.  After fluid was adequately evacuated, spring-loaded grasping forceps was used at one of the ports to elevate the ovary and wall of the cyst and with progressive dissection using the Trio device, the infundibulopelvic ligament, round ligament, and attachment of the ovary and mass to the peritoneum was progressively coagulated and divided, freeing up the tube and ovary. Attention was then turned to the left side, which was normal in appearance,  although the ovary seemed to be somewhat enlarged for her age and her mental status.  It did not have any external excrescences. There were no lesions on the peritoneum or omentum.  The liver appeared to be normal.  Using the same Trio device, the left tube and ovary were dissected free.  The lower incision was then enlarged to allow placement of a 10 mm trocar and sleeve was used to pass endobag, and the ovary from the right as well as the cyst was captured in the endobag.  The incision was extended with sharp dissection to approximately 3.5 to 4 cm, and using the endobag, the mass was delivered through the abdominal wall.  Using laparoscopic visualization and the ring grasping forceps, the other ovary was placed just beneath the incision, was grasped with a Tresa Endo and removed.  Inspection revealed complete removal of both tubes and ovaries.  The lower incision was then closed in layers with running 0-Vicryl to close the fascia, 2-0 Vicryl to close the subcutaneous tissue, and a running subcuticular stitch and Steri-Strips to close the skin.  The 2 remaining ports were then used to irrigate carefully and to confirm hemostasis.  Photographs were made during different portions of the procedure including a photograph of the pelvis after resection of both tubes and ovaries.  These were retained in the office record.  With complete hemostasis, the fluid was evacuated from the abdomen.  The gas was allowed to escape.  Remaining incisions were closed with interrupted subcuticular sutures of 3-0 Vicryl.  0.25% plain Marcaine was injected into the skin and all 3 incisions.  Steri-Strips were applied to the lower and mid incision.  The patient was awakened, taken to recovery in good condition.     Freddy Finner, M.D.     WRN/MEDQ  D:  12/14/2012  T:  12/15/2012  Job:  [id number]

## 2012-12-15 NOTE — Progress Notes (Signed)
Patient discharged home.  Patient and family verbalized understanding of discharge instructions.  Patient ambulated to car without difficulty.

## 2012-12-16 NOTE — Discharge Summary (Signed)
[name] Molina, [name] Molina              ACCOUNT NO.:  [id number]  MEDICAL RECORD NO.:  [id number]  LOCATION:  9305                          FACILITY:  [facility]  PHYSICIAN:  Freddy Finner, M.D.   DATE OF BIRTH:  [birth year]  DATE OF ADMISSION:  12/12/2012 DATE OF DISCHARGE:  12/15/2012                              DISCHARGE SUMMARY   DISCHARGE DIAGNOSIS:  Large right ovarian cyst with torsion.  OPERATIVE PROCEDURE:  Laparoscopy, drainage, and resection of right ovarian cyst, fallopian tube, and hematoma associated with torsion, and left salpingo-oophorectomy.  INTRAOPERATIVE AND POSTOPERATIVE COMPLICATIONS:  None.  DISPOSITION:  The patient is in excellent improved condition on the morning of discharge, which is approximately 24 hours postop.  She is tolerating a regular diet.  She is voiding without catheter.  She is ambulating without assistance.  Her condition is considered to be good.  She is discharged home to resume all of her preoperative medications specifically transdermal estrogen replacement.  She was given Percocet to be taken for postoperative pain.  She is to return to the office in 1 week for her 1st postoperative visit.  She is to avoid heavy lifting or vaginal entry.  She is to drive car only when not on narcotics.  The details of the present illness and physical exam are recorded in the admission note and or the progress note.  She was admitted by Dr. Marcelle Overlie and given pain medication.  On my 1st evaluation on December 13, 2012, her abdomen was not acute, but was tender. She was complaining of persistent pain, but was otherwise clinically stable and for that reason, she was posted for surgery on December 14, 2012. This was accomplished without difficulty and without intraoperative complications.  Frozen section of the mass intraoperatively showed benign findings.  By the morning after surgery, the patient was in good condition.  Her hemoglobin was stable.  She was discharged  home with disposition as noted above.     Freddy Finner, M.D.     WRN/MEDQ  D:  12/15/2012  T:  12/16/2012  Job:  [id number]

## 2012-12-17 LAB — MRSA CULTURE

## 2012-12-23 ENCOUNTER — Ambulatory Visit: Payer: BC Managed Care – PPO | Admitting: Gynecologic Oncology

## 2013-01-03 ENCOUNTER — Encounter (INDEPENDENT_AMBULATORY_CARE_PROVIDER_SITE_OTHER): Payer: Self-pay

## 2013-01-12 ENCOUNTER — Ambulatory Visit (INDEPENDENT_AMBULATORY_CARE_PROVIDER_SITE_OTHER): Payer: BC Managed Care – PPO | Admitting: Surgery

## 2013-01-12 ENCOUNTER — Encounter (INDEPENDENT_AMBULATORY_CARE_PROVIDER_SITE_OTHER): Payer: Self-pay | Admitting: Surgery

## 2013-01-12 VITALS — BP 120/70 | HR 69 | Temp 98.2°F | Resp 18 | Ht 60.0 in | Wt 145.4 lb

## 2013-01-12 DIAGNOSIS — K4090 Unilateral inguinal hernia, without obstruction or gangrene, not specified as recurrent: Secondary | ICD-10-CM

## 2013-01-12 NOTE — Progress Notes (Signed)
General Surgery [facility]  Visit Diagnoses: 1. Inguinal hernia unilateral, non-recurrent, left     HISTORY: Patient returns for her second postoperative visit having undergone left inguinal hernia repair with mesh. Since her last visit she was seen by her gynecologist. She developed acute pain associated with her intra-abdominal mass. She required surgery with bilateral salpingo-oophorectomy and resection of the mass. Fortunately final pathology results were benign. She is now recovering nicely.  EXAM: Surgical incisions are well-healed. No sign of infection. With Valsalva and sit up maneuver there is no sign of recurrent hernia.  IMPRESSION: Status post left inguinal hernia repair with mesh  PLAN: Patient is released to full activity without restriction. She will see her gynecologist in follow-up. She will return to see [place] for surgical care as needed.  Velora Heckler, MD, FACS General & Endocrine Surgery [facility]

## 2013-01-12 NOTE — Patient Instructions (Signed)
  COCOA BUTTER & VITAMIN E CREAM  ([employer] or other brand)  Apply cocoa butter/vitamin E cream to your incision 2 - 3 times daily.  Massage cream into incision for one minute with each application.  Use sunscreen (50 SPF or higher) for first 6 months after surgery if area is exposed to sun.  You may substitute Mederma or other scar reducing creams as desired.   

## 2013-06-24 ENCOUNTER — Telehealth (INDEPENDENT_AMBULATORY_CARE_PROVIDER_SITE_OTHER): Payer: Self-pay

## 2013-06-24 NOTE — Telephone Encounter (Signed)
The pt called to report she still has some tenderness to the side of her incision that comes and goes.  She thought she would have been healed up by now.  I told her it can take 6-12 months to fully heal and it may be muscle or nerve pain.  There is no swelling. Give it more time.  She can try taking Ibuprofen.

## 2013-07-11 ENCOUNTER — Encounter (INDEPENDENT_AMBULATORY_CARE_PROVIDER_SITE_OTHER): Payer: Self-pay | Admitting: Surgery

## 2013-07-11 ENCOUNTER — Ambulatory Visit (INDEPENDENT_AMBULATORY_CARE_PROVIDER_SITE_OTHER): Payer: BC Managed Care – PPO | Admitting: Surgery

## 2013-07-11 VITALS — BP 90/68 | HR 68 | Temp 98.1°F | Resp 14 | Ht 61.0 in | Wt 149.0 lb

## 2013-07-11 DIAGNOSIS — R1032 Left lower quadrant pain: Secondary | ICD-10-CM | POA: Insufficient documentation

## 2013-07-11 DIAGNOSIS — R52 Pain, unspecified: Secondary | ICD-10-CM

## 2013-07-11 MED ORDER — NAPROXEN 500 MG PO TABS: 500.0000 mg | ORAL_TABLET | Freq: Two times a day (BID) | ORAL | Status: DC

## 2013-07-11 NOTE — Progress Notes (Signed)
General Surgery [facility]  Chief Complaint  Patient presents with  . Follow-up    LIH repair 11/11/2012 - rule out recurrence    HISTORY: Patient is a 57 year old female who underwent left inguinal hernia repair of mesh on 11/11/2012. At the time of hernia surgery, she was noted to have an intra-abdominal mass. She subsequently required hysterectomy for removal of ovarian masses. Fortunately all of her pathologic results were benign.  Beginning in September of this year, the patient has experienced a sharp pain in the left lower quadrant of the abdomen. She has noted tenderness near the site of her left inguinal hernia repair. She denies any bulge. She denies any change in her bowel habits. She presents today for further evaluation.  PERTINENT REVIEW OF SYSTEMS: Denies diarrhea or constipation. Intermittent pain left groin and left lower quadrant of abdomen. Occasional indigestion.  EXAM: HEENT: normocephalic; pupils equal and reactive; sclerae clear; dentition good; mucous membranes moist NECK:  symmetric on extension; no palpable anterior or posterior cervical lymphadenopathy; no supraclavicular masses; no tenderness CHEST: clear to auscultation bilaterally without rales, rhonchi, or wheezes CARDIAC: regular rate and rhythm without significant murmur; peripheral pulses are full EXT:  non-tender without edema; no deformity ABDOMEN: Well-healed surgical incisions, completely epithelialized; palpation shows moderate tenderness in the left groin around the region of the mesh repair of left inguinal hernia; with Valsalva and set up maneuver there is no sign of recurrence of her left inguinal hernia and no sign of incisional hernia at her midline surgical wounds  IMPRESSION: Left lower quadrant and left inguinal abdominal pain and tenderness of undetermined etiology  PLAN: I discussed options for management with the patient. I am going to start her on Naprosyn 500 mg  twice a day for 2 weeks. We will also order a CT scan of the abdomen and pelvis to evaluate her left lower quadrant abdominal pain and to assess the site of hernia repair. Patient will return in 3-4 weeks for assessment.  Velora Heckler, MD, [facility] Office: [phone]

## 2013-07-11 NOTE — Patient Instructions (Signed)
Please register for MyChart so that you may access your results online.  Wrangler Penning M. Valkyrie Guardiola, MD, FACS [facility] Office: [phone]   

## 2013-07-15 ENCOUNTER — Ambulatory Visit
Admission: RE | Admit: 2013-07-15 | Discharge: 2013-07-15 | Disposition: A | Discharge status: Home / Self Care | Payer: BC Managed Care – PPO | Source: Ambulatory Visit | Attending: Surgery | Admitting: Surgery

## 2013-07-15 DIAGNOSIS — R1032 Left lower quadrant pain: Secondary | ICD-10-CM

## 2013-07-15 MED ORDER — IOHEXOL 300 MG/ML  SOLN
100.0000 mL | Freq: Once | INTRAMUSCULAR | Status: AC | PRN
  Administered 2013-07-15: 100 mL via INTRAVENOUS

## 2013-07-19 ENCOUNTER — Telehealth (INDEPENDENT_AMBULATORY_CARE_PROVIDER_SITE_OTHER): Payer: Self-pay

## 2013-07-19 NOTE — Telephone Encounter (Signed)
Pt called requesting CT result. Ct result No acute abdominal or pelvic mass,lesion or abnormality noted given to pt. Pt advised Dr Gerrit Friends will return tomorrow and I will also ask him to review report to see if there is any follow up needed. Pt states she will plan on giving more time for discomfort to resolve.

## 2013-07-25 ENCOUNTER — Telehealth (INDEPENDENT_AMBULATORY_CARE_PROVIDER_SITE_OTHER): Payer: Self-pay

## 2013-07-25 NOTE — Telephone Encounter (Signed)
CT scan shows no sign of hernia or other abnormality to explain patient's pain.  Recommend patient try course of NSAID's as prescribed.  Patient should contact [facility] office for follow up appointment if pain persists.  Velora Heckler, MD, [facility] Office: [phone]

## 2013-07-25 NOTE — Telephone Encounter (Signed)
LMOM for pt to call for msg. Per Dr Gerrit Friends need to advise pt Ct show no sign of hernia or mass that would explain pts pain. Pt should continue NSAIDs anc call of pain continues.

## 2013-07-26 NOTE — Telephone Encounter (Signed)
Pt returned call and given attached msg.

## 2013-11-29 ENCOUNTER — Encounter: Payer: Self-pay | Admitting: Gastroenterology

## 2013-12-02 ENCOUNTER — Encounter: Payer: Self-pay | Admitting: *Deleted

## 2013-12-05 ENCOUNTER — Ambulatory Visit (INDEPENDENT_AMBULATORY_CARE_PROVIDER_SITE_OTHER): Payer: BC Managed Care – PPO | Admitting: Gastroenterology

## 2013-12-05 ENCOUNTER — Encounter: Payer: Self-pay | Admitting: Gastroenterology

## 2013-12-05 VITALS — BP 98/58 | HR 68 | Ht 61.0 in | Wt 151.6 lb

## 2013-12-05 DIAGNOSIS — R1013 Epigastric pain: Secondary | ICD-10-CM

## 2013-12-05 DIAGNOSIS — K219 Gastro-esophageal reflux disease without esophagitis: Secondary | ICD-10-CM

## 2013-12-05 DIAGNOSIS — R9389 Abnormal findings on diagnostic imaging of other specified body structures: Secondary | ICD-10-CM

## 2013-12-05 MED ORDER — OMEPRAZOLE 40 MG PO CPDR: 40.0000 mg | DELAYED_RELEASE_CAPSULE | Freq: Every day | ORAL | Status: DC

## 2013-12-05 NOTE — Progress Notes (Addendum)
12/05/2013 [name] Molina [id number] 08-24-1956   HISTORY OF PRESENT ILLNESS:  This is a pleasant 58 year old female who presents to our office at the request of her PCP, Dr. Noberto Retort. She comes in today with complaints of epigastric abdominal pain. She states that around the time of her surgery to remove a large ovarian cyst approximately 1 year ago she started to experience some discomfort in her epigastrium.  It was never really bothersome until approximately the last 2 months or so.  This seems to be associated with heartburn and reflux as well. Her PCP ordered labs, which we are trying to obtain. The patient describes an abnormal test, which sounds like it may have been an H. pylori antibody. She also admits to some mild nausea, but no vomiting, decreased appetite, or weight loss. She has not been placed on any medications for this issue, but has taken some OTC antacids on occasion. She had a CT scan in March of 2014 at which time her large ovarian cyst was discovered, but it also showed some thickening of the mucosa in the distal esophagus, likely indicating esophagitis.  Since that time she had another CT scan of the abdomen and pelvis in October 2014, which did not show the above-stated findings in the esophagus and reported no significant abnormalities at all.  She denies any bowel issues. She had colonoscopy by Dr. Sharlett Iles in March 2009 at which time she was found to have only hemorrhoids noted.  Past Medical History  Diagnosis Date  . Thyroid disease   . Headache(784.0)   . Hemorrhoids 11-15-2007  . IBS (irritable bowel syndrome)    Past Surgical History  Procedure Laterality Date  . Abdominal hysterectomy    . Tonsillectomy    . Laparoscopy N/A 12/14/2012    Procedure: LAPAROSCOPY OPERATIVE;  Surgeon: Maisie Fus, MD;  Location: [facility];  Service: Gynecology;  Laterality: N/A;  with Bilateral Ovarian Cystectomies  . Colonoscopy  11/15/2007    Dr. Sharlett Iles     reports that she has never  smoked. She does not have any smokeless tobacco history on file. She reports that she drinks alcohol. She reports that she does not use illicit drugs. family history includes Cancer in her mother; Heart disease in her father. Allergies  Allergen Reactions  . Morphine And Related Nausea And Vomiting  . Benadryl [Diphenhydramine Hcl] Other (See Comments)    Anxious & jittery  . Codeine Nausea Only      Outpatient Encounter Prescriptions as of 12/05/2013  Medication Sig  . estradiol (VIVELLE-DOT) 0.05 MG/24HR Place 1 patch onto the skin once a week.  . levothyroxine (SYNTHROID, LEVOTHROID) 88 MCG tablet Take 88 mcg by mouth daily.  Marland Kitchen omeprazole (PRILOSEC) 40 MG capsule Take 1 capsule (40 mg total) by mouth daily.  . [DISCONTINUED] naproxen (NAPROSYN) 500 MG tablet Take 1 tablet (500 mg total) by mouth 2 (two) times daily with a meal.     REVIEW OF SYSTEMS  : All other systems reviewed and negative except where noted in the History of Present Illness.   PHYSICAL EXAM: BP 98/58  Pulse 68  Ht 5\' 1"  (1.549 m)  Wt 151 lb 9.6 oz (68.765 kg)  BMI 28.66 kg/m2 General: Well developed white female in no acute distress Head: Normocephalic and atraumatic Eyes:  Sclerae anicteric, conjunctiva pink. Ears: Normal auditory acuity. Lungs: Clear throughout to auscultation Heart: Regular rate and rhythm Abdomen: Soft, non-distended.  Normal bowel sounds.  Mild epigastric TTP without R/R/G. Musculoskeletal: Symmetrical  with no gross deformities  Skin: No lesions on visible extremities Extremities: No edema  Neurological: Alert oriented x 4, grossly non-focal Psychological:  Alert and cooperative. Normal mood and affect  ASSESSMENT AND PLAN: -Epigastric abdominal pain with GERD and previous CT scan showing some thickening of the mucosal in the distal esophagus, likely indicating esophagitis. Questionable positive H. pylori serology.  We will schedule patient for an EGD for further evaluation and  for gastric biopsies to confirm H. pylori. In the interim we'll place her on omeprazole 40 mg daily and also give her the GERD dietary instructions to follow. We are trying to obtain the results of her recent labs from her PCP.  Addendum: Reviewed and agree with initial management. Jerene Bears, MD

## 2013-12-05 NOTE — Patient Instructions (Addendum)
You have been scheduled for an endoscopy with propofol. Please follow written instructions given to you at your visit today. If you use inhalers (even only as needed), please bring them with you on the day of your procedure. Your physician has requested that you go to www.startemmi.com and enter the access code given to you at your visit today. This web site gives a general overview about your procedure. However, you should still follow specific instructions given to you by our office regarding your preparation for the procedure.  We have sent the following medications to your pharmacy for you to pick up at your convenience: Omeprazole 40 mg, please take one capsule by mouth once daily

## 2013-12-13 ENCOUNTER — Encounter: Payer: Self-pay | Admitting: Internal Medicine

## 2013-12-19 ENCOUNTER — Ambulatory Visit (AMBULATORY_SURGERY_CENTER): Payer: BC Managed Care – PPO | Admitting: Internal Medicine

## 2013-12-19 ENCOUNTER — Encounter: Payer: Self-pay | Admitting: Internal Medicine

## 2013-12-19 VITALS — BP 105/74 | HR 65 | Temp 96.6°F | Resp 15 | Ht 61.0 in | Wt 151.0 lb

## 2013-12-19 DIAGNOSIS — K2940 Chronic atrophic gastritis without bleeding: Secondary | ICD-10-CM

## 2013-12-19 DIAGNOSIS — K259 Gastric ulcer, unspecified as acute or chronic, without hemorrhage or perforation: Secondary | ICD-10-CM

## 2013-12-19 DIAGNOSIS — K219 Gastro-esophageal reflux disease without esophagitis: Secondary | ICD-10-CM

## 2013-12-19 DIAGNOSIS — R9389 Abnormal findings on diagnostic imaging of other specified body structures: Secondary | ICD-10-CM

## 2013-12-19 DIAGNOSIS — R1013 Epigastric pain: Secondary | ICD-10-CM

## 2013-12-19 MED ORDER — SODIUM CHLORIDE 0.9 % IV SOLN: 500.0000 mL | INTRAVENOUS | Status: DC

## 2013-12-19 MED ORDER — OMEPRAZOLE 40 MG PO CPDR: DELAYED_RELEASE_CAPSULE | ORAL | Status: DC

## 2013-12-19 NOTE — Progress Notes (Signed)
Pt stable to RR 

## 2013-12-19 NOTE — Progress Notes (Signed)
Called to room to assist during endoscopic procedure.  Patient ID and intended procedure confirmed with present staff. Received instructions for my participation in the procedure from the performing physician.  

## 2013-12-19 NOTE — Patient Instructions (Addendum)
Information on esophagitis given to you today  Avoid NSAIDS   Continue Omeprazole 40 mg twice daily for one month then daily for 3 months   office follow up in 6-8 weeks   YOU HAD AN ENDOSCOPIC PROCEDURE TODAY AT [place]: Refer to the procedure report that was given to you for any specific questions about what was found during the examination.  If the procedure report does not answer your questions, please call your gastroenterologist to clarify.  If you requested that your care partner not be given the details of your procedure findings, then the procedure report has been included in a sealed envelope for you to review at your convenience later.  YOU SHOULD EXPECT: Some feelings of bloating in the abdomen. Passage of more gas than usual.  Walking can help get rid of the air that was put into your GI tract during the procedure and reduce the bloating. If you had a lower endoscopy (such as a colonoscopy or flexible sigmoidoscopy) you may notice spotting of blood in your stool or on the toilet paper. If you underwent a bowel prep for your procedure, then you may not have a normal bowel movement for a few days.  DIET: Your first meal following the procedure should be a light meal and then it is ok to progress to your normal diet.  A half-sandwich or bowl of soup is an example of a good first meal.  Heavy or fried foods are harder to digest and may make you feel nauseous or bloated.  Likewise meals heavy in dairy and vegetables can cause extra gas to form and this can also increase the bloating.  Drink plenty of fluids but you should avoid alcoholic beverages for 24 hours.  ACTIVITY: Your care partner should take you home directly after the procedure.  You should plan to take it easy, moving slowly for the rest of the day.  You can resume normal activity the day after the procedure however you should NOT DRIVE or use heavy machinery for 24 hours (because of the sedation medicines  used during the test).    SYMPTOMS TO REPORT IMMEDIATELY: A gastroenterologist can be reached at any hour.  During normal business hours, 8:30 AM to 5:00 PM Monday through Friday, call [phone].  After hours and on weekends, please call the GI answering service at [phone] who will take a message and have the physician on call contact you.   Following upper endoscopy (EGD)  Vomiting of blood or coffee ground material  New chest pain or pain under the shoulder blades  Painful or persistently difficult swallowing  New shortness of breath  Fever of 100F or higher  Black, tarry-looking stools  FOLLOW UP: If any biopsies were taken you will be contacted by phone or by letter within the next 1-3 weeks.  Call your gastroenterologist if you have not heard about the biopsies in 3 weeks.  Our staff will call the home number listed on your records the next business day following your procedure to check on you and address any questions or concerns that you may have at that time regarding the information given to you following your procedure. This is a courtesy call and so if there is no answer at the home number and we have not heard from you through the emergency physician on call, we will assume that you have returned to your regular daily activities without incident.  SIGNATURES/CONFIDENTIALITY: You and/or your care partner have signed  paperwork which will be entered into your electronic medical record.  These signatures attest to the fact that that the information above on your After Visit Summary has been reviewed and is understood.  Full responsibility of the confidentiality of this discharge information lies with you and/or your care-partner.

## 2013-12-19 NOTE — Op Note (Signed)
[facility]  [address] [place], [address]   ENDOSCOPY PROCEDURE REPORT  PATIENT: [name], Molina  MR#: [id number] BIRTHDATE: 1955-11-14 , 40  yrs. old GENDER: Female ENDOSCOPIST: Jerene Bears, MD PROCEDURE DATE:  12/19/2013 PROCEDURE:  EGD w/ biopsy ASA CLASS:     Class II INDICATIONS:  Epigastric pain.   Heartburn. MEDICATIONS: MAC sedation, administered by CRNA and propofol (Diprivan) 100mg  IV TOPICAL ANESTHETIC: none  DESCRIPTION OF PROCEDURE: After the risks benefits and alternatives of the procedure were thoroughly explained, informed consent was obtained.  The LB [id number] endoscope was introduced through the mouth and advanced to the second portion of the duodenum. Without limitations.  The instrument was slowly withdrawn as the mucosa was fully examined.    ESOPHAGUS: Very mild reflux esophagitis was found at the gastroesophageal junction.  Esophagitis was LA Grade A: breaks across 1-2 folds, <5 mm.   The esophagus was otherwise normal.  STOMACH: Multiple small, non-bleeding, round and 1 linear, clean-based ulcers were found in the gastric antrum.  Biopsies were taken.   There was associated antral gastritis.  Proximal stomach unremarkable.  DUODENUM: The duodenal mucosa showed no abnormalities in the bulb and second portion of the duodenum.  Retroflexed views revealed no abnormalities.     The scope was then withdrawn from the patient and the procedure completed.  COMPLICATIONS: There were no complications. ENDOSCOPIC IMPRESSION: 1.   Esophagitis consistent with reflux esophagitis at the gastroesophageal junction 2.   The esophagus was otherwise normal. 3.   Multiple small non-bleeding ulcers were found in the gastric antrum 4.   The duodenal mucosa showed no abnormalities in the bulb and second portion of the duodenum  RECOMMENDATIONS: 1.  Await biopsy results 2.  Follow-up of helicobacter pylori status, treat if  indicated 3.  Avoid NSAIDs 4.  Omeprazole 40 mg twice daily (30 min before breakfast and dinner) for 1 month and then daily for at least 3 months.  Office follow-up next available (in 6-8 weeks)  eSigned:  Jerene Bears, MD 12/19/2013 10:34 AM            CC:The Patient, Zehr, Maylon Peppers, and Nena Polio, FNP

## 2013-12-20 ENCOUNTER — Telehealth: Payer: Self-pay | Admitting: *Deleted

## 2013-12-20 NOTE — Telephone Encounter (Signed)
  Follow up Call-  Call back number 12/19/2013  Post procedure Call Back phone  # [phone] hm  Permission to leave phone message Yes     Patient questions:  Do you have a fever, pain , or abdominal swelling? no Pain Score  0 *  Have you tolerated food without any problems? yes  Have you been able to return to your normal activities? yes  Do you have any questions about your discharge instructions: Diet   no Medications  no Follow up visit  no  Do you have questions or concerns about your Care? no  Actions: * If pain score is 4 or above: No action needed, pain <4.

## 2013-12-25 ENCOUNTER — Encounter: Payer: Self-pay | Admitting: Internal Medicine

## 2013-12-26 ENCOUNTER — Other Ambulatory Visit: Payer: Self-pay

## 2013-12-26 MED ORDER — BIS SUBCIT-METRONID-TETRACYC 140-125-125 MG PO CAPS: 3.0000 | ORAL_CAPSULE | Freq: Three times a day (TID) | ORAL | Status: DC

## 2013-12-30 ENCOUNTER — Telehealth: Payer: Self-pay | Admitting: Internal Medicine

## 2013-12-30 NOTE — Telephone Encounter (Signed)
Patient advised to try taking Pylera with food.  She will call back for any additional questions or concerns

## 2014-01-06 ENCOUNTER — Telehealth: Payer: Self-pay | Admitting: Internal Medicine

## 2014-01-06 NOTE — Telephone Encounter (Signed)
I have left a message that should be ok, but no guarantees.  She is advised via voicemail that can't assure she won't get sick.  Suggested she call a pharmacist, they maybe able to give her more information.  She is asked to call back for any questions

## 2014-07-17 ENCOUNTER — Encounter: Payer: Self-pay | Admitting: Internal Medicine

## 2015-01-29 ENCOUNTER — Other Ambulatory Visit: Payer: Self-pay | Admitting: Obstetrics & Gynecology

## 2015-01-30 LAB — CYTOLOGY - PAP

## 2016-01-31 ENCOUNTER — Other Ambulatory Visit: Payer: Self-pay | Admitting: Obstetrics & Gynecology

## 2016-01-31 DIAGNOSIS — N6320 Unspecified lump in the left breast, unspecified quadrant: Secondary | ICD-10-CM

## 2016-02-06 ENCOUNTER — Ambulatory Visit
Admission: RE | Admit: 2016-02-06 | Discharge: 2016-02-06 | Disposition: A | Discharge status: Home / Self Care | Payer: BLUE CROSS/BLUE SHIELD | Source: Ambulatory Visit | Attending: Obstetrics & Gynecology | Admitting: Obstetrics & Gynecology

## 2016-02-06 ENCOUNTER — Other Ambulatory Visit: Payer: Self-pay | Admitting: Obstetrics & Gynecology

## 2016-02-06 DIAGNOSIS — N6320 Unspecified lump in the left breast, unspecified quadrant: Secondary | ICD-10-CM

## 2017-06-01 ENCOUNTER — Telehealth: Payer: Self-pay | Admitting: Internal Medicine

## 2017-06-01 NOTE — Telephone Encounter (Signed)
Pt states she has been having rectal bleeding, more than what she has had with hemorrhoids. Pts PCP wanted her to set up a colonoscopy. Pt scheduled to see Ellouise Newer PA 06/03/17@1 :45pm. Pt aware of appt.

## 2017-06-01 NOTE — Telephone Encounter (Signed)
Left message for pt to call back  °

## 2017-06-03 ENCOUNTER — Encounter: Payer: Self-pay | Admitting: Physician Assistant

## 2017-06-03 ENCOUNTER — Ambulatory Visit (INDEPENDENT_AMBULATORY_CARE_PROVIDER_SITE_OTHER): Payer: BLUE CROSS/BLUE SHIELD | Admitting: Physician Assistant

## 2017-06-03 ENCOUNTER — Encounter (INDEPENDENT_AMBULATORY_CARE_PROVIDER_SITE_OTHER): Payer: Self-pay

## 2017-06-03 VITALS — BP 100/72 | HR 64 | Ht 61.0 in | Wt 144.4 lb

## 2017-06-03 DIAGNOSIS — K921 Melena: Secondary | ICD-10-CM | POA: Diagnosis not present

## 2017-06-03 DIAGNOSIS — R1084 Generalized abdominal pain: Secondary | ICD-10-CM | POA: Diagnosis not present

## 2017-06-03 DIAGNOSIS — G43A1 Cyclical vomiting, intractable: Secondary | ICD-10-CM | POA: Diagnosis not present

## 2017-06-03 DIAGNOSIS — R197 Diarrhea, unspecified: Secondary | ICD-10-CM

## 2017-06-03 DIAGNOSIS — R1115 Cyclical vomiting syndrome unrelated to migraine: Secondary | ICD-10-CM

## 2017-06-03 MED ORDER — NA SULFATE-K SULFATE-MG SULF 17.5-3.13-1.6 GM/177ML PO SOLN: 1.0000 | ORAL | 0 refills | Status: DC

## 2017-06-03 NOTE — Patient Instructions (Signed)
Your physician has requested that you go to the basement for the following lab work before leaving today:  You have been scheduled for an endoscopy and colonoscopy. Please follow the written instructions given to you at your visit today. Please pick up your prep supplies at the pharmacy within the next 1-3 days. If you use inhalers (even only as needed), please bring them with you on the day of your procedure. Your physician has requested that you go to www.startemmi.com and enter the access code given to you at your visit today. This web site gives a general overview about your procedure. However, you should still follow specific instructions given to you by our office regarding your preparation for the procedure.

## 2017-06-03 NOTE — Progress Notes (Addendum)
Chief Complaint: Hematochezia, diarrhea, abdominal pain, nausea and vomiting  HPI:  [name] Molina is a 61 year old Caucasian female who was referred to me by Nena Polio, NP for a complaint of diarrhea, hematochezia, abdominal pain, nausea and vomiting .      Patient was followed for diarrhea by her primary care physician in May of this year. Labs were negative/normal including CBC, CMP and CT of the abdomen and pelvis. I do not see stool studies.   Patient's last colonoscopy was completed 11/15/07 by Dr. Sharlett Iles with a finding of external hemorrhoids. The patient then had an EGD in 12/19/13 with Dr. Hilarie Fredrickson which showed esophagitis consistent with reflux as well as non-bleeding ulcers and patient was placed on Omeprazole 40 mg twice a day   Today, the patient explains that in May she was sick after eating eggs for multiple days in a row, "eggs have always bothered me". She describes an extensive workup at that time for diarrhea which was all negative. Since that time, the patient has had 2 episodes of diarrhea which are severe and extreme. She notes the last one occurred yesterday evening. Around 11 PM, she started with nausea and vomiting and then started with extreme diarrhea and abdominal cramps all throughout the night. Patient tells me she has so many bowel movements at this time that by the end she is just producing mucus. Patient also describes bright red blood with all of her bowel movements, sometimes more than others over the past few months. She says it is very rare to have a bowel movement which she does not see any bright red blood. Sometimes this fills the toilet. In between times of the diarrhea the patient does have normal solid stool.   Patient also describes episodes of nausea and vomiting which also seem to occur at night. These are sometimes with diarrhea above and other times are not. Associated symptoms include an epigastric cramping pain.   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 Per her these were benign in 2016.   Patient denies fever, chills, weight loss, fatigue, heartburn, reflux or dysphagia.  Past Medical History:  Diagnosis Date  . Anemia   . Anxiety   . Depression   . Headache(784.0)   . Hemorrhoids 11-15-2007  . IBS (irritable bowel syndrome)   . Osteoporosis   . Thyroid disease     Past Surgical History:  Procedure Laterality Date  . ABDOMINAL HYSTERECTOMY    . COLONOSCOPY  11/15/2007   Dr. Sharlett Iles   . INGUINAL HERNIA REPAIR    . LAPAROSCOPY N/A 12/14/2012   Procedure: LAPAROSCOPY OPERATIVE;  Surgeon: Maisie Fus, MD;  Location: [facility];  Service: Gynecology;  Laterality: N/A;  with Bilateral Ovarian Cystectomies  . ovaries removed    . TONSILLECTOMY      Current Outpatient Prescriptions  Medication Sig Dispense Refill  . estradiol (VIVELLE-DOT) 0.05 MG/24HR Place 1 patch onto the skin once a week.    . levothyroxine (SYNTHROID, LEVOTHROID) 88 MCG tablet Take 88 mcg by mouth daily.    . SUMAtriptan (IMITREX) 100 MG tablet Take 1 tablet by mouth as needed.     No current facility-administered medications for this visit.     Allergies as of 06/03/2017 - Review Complete 06/03/2017  Allergen Reaction Noted  . Morphine and related Nausea And Vomiting 01/12/2013  . Benadryl [diphenhydramine hcl] Other (See Comments) 10/19/2012  . Codeine Nausea Only 10/19/2012  Family History  Problem Relation Age of Onset  . Cancer Mother   . Heart disease Father   . Prostate cancer Brother   . Colon cancer Neg Hx   . Esophageal cancer Neg Hx   . Rectal cancer Neg Hx   . Stomach cancer Neg Hx     Social History   Social History  . Marital status: Married    Spouse name: N/A  . Number of children: 2  . Years of education: N/A   Occupational History  . Not on file.   Social History Main Topics  . Smoking status: Never Smoker  . Smokeless tobacco:  Never Used  . Alcohol use 1.8 oz/week    3 Glasses of wine per week     Comment: 2-3 glasses of wine through out the week  . Drug use: No  . Sexual activity: No   Other Topics Concern  . Not on file   Social History Narrative  . No narrative on file    Review of Systems:    Constitutional: No weight loss, fever or chills Skin: No rash  Cardiovascular: No chest pain  Respiratory: No SOB Gastrointestinal: See HPI and otherwise negative Genitourinary: No dysuria Neurological: No headache Musculoskeletal: No new muscle or joint pain Hematologic: No bruising Psychiatric: No history of depression or anxiety   Physical Exam:  Vital signs: BP 100/72   Pulse 64   Ht 5\' 1"  (1.549 m)   Wt 144 lb 6.4 oz (65.5 kg)   BMI 27.28 kg/m   Constitutional:   Pleasant Caucasian female appears to be in NAD, Well developed, Well nourished, alert and cooperative Head:  Normocephalic and atraumatic. Eyes:   PEERL, EOMI. No icterus. Conjunctiva pink. Ears:  Normal auditory acuity. Neck:  Supple Throat: Oral cavity and pharynx without inflammation, swelling or lesion.  Respiratory: Respirations even and unlabored. Lungs clear to auscultation bilaterally.   No wheezes, crackles, or rhonchi.  Cardiovascular: Normal S1, S2. No MRG. Regular rate and rhythm. No peripheral edema, cyanosis or pallor.  Gastrointestinal:  Soft, nondistended, mild epigastric and left sided abdominal pain. No rebound or guarding. Normal bowel sounds. No appreciable masses or hepatomegaly. Rectal:  Not performed.  Msk:  Symmetrical without gross deformities. Without edema, no deformity or joint abnormality.  Neurologic:  Alert and  oriented x4;  grossly normal neurologically.  Skin:   Dry and intact without significant lesions or rashes. Psychiatric: Demonstrates good judgement and reason without abnormal affect or behaviors.  RECENT IMAGING: IMPRESSION: 1.No acute process. 2.Mild fullness in the pelvis and perineum  as above. Correlate with gynecological/and rectal examination 3.Mild scoliosis  Result Narrative  INDICATION: Abd pain, gastroenteritis or colitis suspected history of pelvic mass. COMPARISON:None. TECHNIQUE:CT ABDOMEN PELVIS W IV CONTRAST - 80 mL Isovue 370 were administered intravenously . Thin axial sections were performed. Multiplanar reformats were generated on a workstation and saved to PACS.Radiation dose reduction was utilized  (automated exposure control, mA or kV adjustment based on patient size, or iterative image reconstruction).  FINDINGS:   VISUALIZED LOWER THORAX: No acute abnormalities.  SOLID VISCERA: Liver: Slightly fatty. No focal lesions. Gallbladder: Contracted with slightly enhancing walls but no pericholecystic fluid. Pancreas: Normal. Adrenal glands: Normal. Spleen: Normal. Kidneys: Normal.  GI: No bowel obstruction. No focal bowel wall thickening or inflammatory changes. Fatty infiltration of the ileocecal valve, benign finding. Appendix not well seen but no evidence of acute appendicitis.  PERITONEAL CAVITY/RETROPERITONEUM: No free fluid. No pneumoperitoneum. Some small left upper quadrant  mesenteric nodes.  PELVIS: Bladder is slightly eccentric to the left. No obvious diverticulum. Uterus is been removed. Vaginal cuff is normal. Vaginal, vaginal bulb and perineal body are mildly prominent. Mild stranding in the ischiorectal fossa. This may be somewhat low lying  (questionably below the pelvic floor). Clinical correlation.   MUSCULOSKELETAL: No acute or destructive osseous processes. Spinal curvature and mild degenerative disease.  MISC: N/A.  Other Result Information  Acute Interface, Incoming Rad Results - 01/14/2017  9:14 AM EDT INDICATION: Abd pain, gastroenteritis or colitis suspected history of pelvic mass. COMPARISON:  None.   TECHNIQUE:  CT ABDOMEN PELVIS W IV CONTRAST - 80 mL Isovue 370 were administered intravenously . Thin  axial sections were performed. Multiplanar reformats were generated on a workstation and saved to PACS.  Radiation dose reduction was utilized  (automated exposure control, mA or kV adjustment based on patient size, or iterative image reconstruction).  FINDINGS:   VISUALIZED LOWER THORAX: No acute abnormalities.  SOLID VISCERA: Liver: Slightly fatty. No focal lesions. Gallbladder: Contracted with slightly enhancing walls but no pericholecystic fluid. Pancreas: Normal. Adrenal glands: Normal. Spleen: Normal. Kidneys: Normal.  GI: No bowel obstruction. No focal bowel wall thickening or inflammatory changes. Fatty infiltration of the ileocecal valve, benign finding. Appendix not well seen but no evidence of acute appendicitis.  PERITONEAL CAVITY/RETROPERITONEUM: No free fluid. No pneumoperitoneum. Some small left upper quadrant mesenteric nodes.  PELVIS: Bladder is slightly eccentric to the left. No obvious diverticulum. Uterus is been removed. Vaginal cuff is normal. Vaginal, vaginal bulb and perineal body are mildly prominent. Mild stranding in the ischiorectal fossa. This may be somewhat low lying  (questionably below the pelvic floor). Clinical correlation.   MUSCULOSKELETAL: No acute or destructive osseous processes. Spinal curvature and mild degenerative disease.  MISC: N/A.   IMPRESSION: 1.  No acute process. 2.  Mild fullness in the pelvis and perineum as above. Correlate with gynecological/and rectal examination 3.  Mild scoliosis    Assessment: 1. Diarrhea: Episodes of frequent loose stools which occur at night, previous workup in May was negative, in between these patient has normal solid stool; consider IBD versus IBS versus food allergy? 2. Generalized abdominal pain: With above 3. Nausea and vomiting: Cyclical episodes of nausea and vomiting which tend to start at night, sometimes related to diarrhea and other times not; consider GERD versus other 4.  Hematochezia: Frequent bright red blood in stools which are sometimes diarrhea; concern for IBD versus other  Plan: 1. Scheduled patient for an EGD and colonoscopy in the [place] with Dr. Hilarie Fredrickson. Did discuss risks, benefits, limitations and alternatives and patient agrees to proceed. 2. Did discuss a PPI with the patient but apparently she has headaches with these in the past, currently she is using Omeprazole 20 mg as needed for increases and reflux. 3. Ordered labs to include CBC, CMP and lipase 4. Patient to follow in clinic per recommendations from Dr. Hilarie Fredrickson after time of procedures as above  Ellouise Newer, PA-C [facility] 06/03/2017, 1:20 PM  Cc: Nena Polio, NP   Addendum: Reviewed and agree with initial management. Pyrtle, Lajuan Lines, MD

## 2017-06-08 ENCOUNTER — Other Ambulatory Visit (INDEPENDENT_AMBULATORY_CARE_PROVIDER_SITE_OTHER): Payer: BLUE CROSS/BLUE SHIELD

## 2017-06-08 DIAGNOSIS — K921 Melena: Secondary | ICD-10-CM

## 2017-06-08 DIAGNOSIS — G43A1 Cyclical vomiting, intractable: Secondary | ICD-10-CM | POA: Diagnosis not present

## 2017-06-08 DIAGNOSIS — R197 Diarrhea, unspecified: Secondary | ICD-10-CM | POA: Diagnosis not present

## 2017-06-08 DIAGNOSIS — R1084 Generalized abdominal pain: Secondary | ICD-10-CM | POA: Diagnosis not present

## 2017-06-08 DIAGNOSIS — R1115 Cyclical vomiting syndrome unrelated to migraine: Secondary | ICD-10-CM

## 2017-06-08 LAB — CBC WITH DIFFERENTIAL/PLATELET
BASOS ABS: 0 10*3/uL (ref 0.0–0.1)
Basophils Relative: 0.8 % (ref 0.0–3.0)
EOS ABS: 0 10*3/uL (ref 0.0–0.7)
Eosinophils Relative: 0.8 % (ref 0.0–5.0)
HEMATOCRIT: 40.8 % (ref 36.0–46.0)
Hemoglobin: 13.2 g/dL (ref 12.0–15.0)
LYMPHS ABS: 1.4 10*3/uL (ref 0.7–4.0)
LYMPHS PCT: 30.1 % (ref 12.0–46.0)
MCHC: 32.5 g/dL (ref 30.0–36.0)
MCV: 93.5 fl (ref 78.0–100.0)
MONO ABS: 0.5 10*3/uL (ref 0.1–1.0)
Monocytes Relative: 11.1 % (ref 3.0–12.0)
NEUTROS ABS: 2.8 10*3/uL (ref 1.4–7.7)
NEUTROS PCT: 57.2 % (ref 43.0–77.0)
PLATELETS: 183 10*3/uL (ref 150.0–400.0)
RBC: 4.37 Mil/uL (ref 3.87–5.11)
RDW: 14.2 % (ref 11.5–15.5)
WBC: 4.8 10*3/uL (ref 4.0–10.5)

## 2017-06-08 LAB — COMPREHENSIVE METABOLIC PANEL
ALT: 12 U/L (ref 0–35)
AST: 15 U/L (ref 0–37)
Albumin: 4 g/dL (ref 3.5–5.2)
Alkaline Phosphatase: 60 U/L (ref 39–117)
BUN: 11 mg/dL (ref 6–23)
CHLORIDE: 107 meq/L (ref 96–112)
CO2: 29 meq/L (ref 19–32)
Calcium: 9.1 mg/dL (ref 8.4–10.5)
Creatinine, Ser: 0.57 mg/dL (ref 0.40–1.20)
GFR: 114.58 mL/min (ref >60.00)
Glucose, Bld: 85 mg/dL (ref 70–99)
Potassium: 3.9 mEq/L (ref 3.5–5.1)
Sodium: 143 mEq/L (ref 135–145)
Total Bilirubin: 0.5 mg/dL (ref 0.2–1.2)
Total Protein: 6.7 g/dL (ref 6.0–8.3)

## 2017-06-08 LAB — IBC PANEL
Iron: 102 ug/dL (ref 42–145)
Saturation Ratios: 33.3 % (ref 20.0–50.0)
Transferrin: 219 mg/dL (ref 212.0–360.0)

## 2017-06-08 LAB — FERRITIN: Ferritin: 84.3 ng/mL (ref 10.0–291.0)

## 2017-06-08 LAB — LIPASE: Lipase: 34 U/L (ref 11.0–59.0)

## 2017-06-11 ENCOUNTER — Telehealth: Payer: Self-pay | Admitting: Internal Medicine

## 2017-06-11 NOTE — Telephone Encounter (Signed)
I have placed a "pay no more than $50" coupon for Suprep in the mail for patient. Patient verbalizes understanding.

## 2017-06-15 NOTE — Telephone Encounter (Signed)
Patient states that she has not received coupon. Best # [phone]

## 2017-06-15 NOTE — Telephone Encounter (Signed)
Since patient did not receive coupon by mail, pharmacy let me call coupon in. Rx will be $50.00. Patient verbalizes understanding.

## 2017-06-17 ENCOUNTER — Ambulatory Visit (AMBULATORY_SURGERY_CENTER): Payer: BLUE CROSS/BLUE SHIELD | Admitting: Internal Medicine

## 2017-06-17 ENCOUNTER — Encounter: Payer: Self-pay | Admitting: Internal Medicine

## 2017-06-17 VITALS — BP 113/68 | HR 62 | Temp 99.1°F | Resp 16 | Ht 61.0 in | Wt 144.0 lb

## 2017-06-17 DIAGNOSIS — K921 Melena: Secondary | ICD-10-CM

## 2017-06-17 DIAGNOSIS — R1084 Generalized abdominal pain: Secondary | ICD-10-CM | POA: Diagnosis not present

## 2017-06-17 DIAGNOSIS — R197 Diarrhea, unspecified: Secondary | ICD-10-CM

## 2017-06-17 DIAGNOSIS — D120 Benign neoplasm of cecum: Secondary | ICD-10-CM

## 2017-06-17 DIAGNOSIS — R110 Nausea: Secondary | ICD-10-CM | POA: Diagnosis not present

## 2017-06-17 MED ORDER — SODIUM CHLORIDE 0.9 % IV SOLN: 500.0000 mL | INTRAVENOUS | Status: DC

## 2017-06-17 NOTE — Op Note (Signed)
[facility] Patient Name: [name] Molina Procedure Date: 06/17/2017 3:20 PM MRN: [id number] Endoscopist: Jerene Bears , MD Age: 61 Referring MD:  Date of Birth: 1955/11/23 Gender: Female Account #: [id number] Procedure:                Upper GI endoscopy Indications:              Generalized abdominal pain, diarrhea, nausea Medicines:                Monitored Anesthesia Care Procedure:                Pre-Anesthesia Assessment:                           - Prior to the procedure, a History and Physical                            was performed, and patient medications and                            allergies were reviewed. The patient's tolerance of                            previous anesthesia was also reviewed. The risks                            and benefits of the procedure and the sedation                            options and risks were discussed with the patient.                            All questions were answered, and informed consent                            was obtained. Prior Anticoagulants: The patient has                            taken no previous anticoagulant or antiplatelet                            agents. ASA Grade Assessment: II - A patient with                            mild systemic disease. After reviewing the risks                            and benefits, the patient was deemed in                            satisfactory condition to undergo the procedure.                           After obtaining informed consent, the endoscope was  passed under direct vision. Throughout the                            procedure, the patient's blood pressure, pulse, and                            oxygen saturations were monitored continuously. The                            Endoscope was introduced through the mouth, and                            advanced to the second part of duodenum. The upper                            GI endoscopy  was accomplished without difficulty.                            The patient tolerated the procedure well. Scope In: Scope Out: Findings:                 The examined esophagus was normal.                           A 2 cm hiatal hernia was present.                           The entire examined stomach was normal.                           The examined duodenum was normal. Complications:            No immediate complications. Estimated Blood Loss:     Estimated blood loss: none. Impression:               - Normal esophagus.                           - 2 cm hiatal hernia.                           - Normal stomach.                           - Normal examined duodenum.                           - No specimens collected. Recommendation:           - Patient has a contact number available for                            emergencies. The signs and symptoms of potential                            delayed complications were discussed with the  patient. Return to normal activities tomorrow.                            Written discharge instructions were provided to the                            patient.                           - Resume previous diet.                           - Continue present medications.                           - See the other procedure note for documentation of                            additional recommendations. Jerene Bears, MD 06/17/2017 3:55:31 PM This report has been signed electronically.

## 2017-06-17 NOTE — Op Note (Signed)
[facility] Patient Name: [name] Molina Procedure Date: 06/17/2017 3:20 PM MRN: [id number] Endoscopist: Jerene Bears , MD Age: 61 Referring MD:  Date of Birth: 02/15/1956 Gender: Female Account #: [id number] Procedure:                Colonoscopy Indications:              Generalized abdominal pain, Clinically significant                            diarrhea of unexplained origin, Hematochezia Medicines:                Monitored Anesthesia Care Procedure:                Pre-Anesthesia Assessment:                           - Prior to the procedure, a History and Physical                            was performed, and patient medications and                            allergies were reviewed. The patient's tolerance of                            previous anesthesia was also reviewed. The risks                            and benefits of the procedure and the sedation                            options and risks were discussed with the patient.                            All questions were answered, and informed consent                            was obtained. Prior Anticoagulants: The patient has                            taken no previous anticoagulant or antiplatelet                            agents. ASA Grade Assessment: II - A patient with                            mild systemic disease. After reviewing the risks                            and benefits, the patient was deemed in                            satisfactory condition to undergo the procedure.  After obtaining informed consent, the colonoscope                            was passed under direct vision. Throughout the                            procedure, the patient's blood pressure, pulse, and                            oxygen saturations were monitored continuously. The                            Model [id number]) scope was introduced                            through the  anus and advanced to the the terminal                            ileum. The colonoscopy was performed without                            difficulty. The patient tolerated the procedure                            well. The quality of the bowel preparation was                            excellent. The terminal ileum, ileocecal valve,                            appendiceal orifice, and rectum were photographed. Scope In: 3:40:15 PM Scope Out: 3:50:55 PM Scope Withdrawal Time: 0 hours 6 minutes 59 seconds  Total Procedure Duration: 0 hours 10 minutes 40 seconds  Findings:                 The perianal exam findings include skin tags.                           The terminal ileum appeared normal.                           A 3 mm polyp was found in the cecum. The polyp was                            sessile. The polyp was removed with a cold biopsy                            forceps. Resection and retrieval were complete.                           Internal hemorrhoids were found during retroflexion                            and during digital exam. The hemorrhoids were  small.                           The exam was otherwise without abnormality. Complications:            No immediate complications. Estimated Blood Loss:     Estimated blood loss was minimal. Impression:               - The examined portion of the ileum was normal.                           - One 3 mm polyp in the cecum, removed with a cold                            biopsy forceps. Resected and retrieved.                           - Internal hemorrhoids.                           - The examination was otherwise normal. Recommendation:           - Patient has a contact number available for                            emergencies. The signs and symptoms of potential                            delayed complications were discussed with the                            patient. Return to normal activities tomorrow.                             Written discharge instructions were provided to the                            patient.                           - Resume previous diet.                           - Continue present medications.                           - Await pathology results.                           - Repeat colonoscopy is recommended. The                            colonoscopy date will be determined after pathology                            results from today's exam become available for  review.                           - Treatment of rifaximin 550 mg TID x 14 days for                            IBS-D. Return office visit in 4-6 weeks to ensure                            improvement. Jerene Bears, MD 06/17/2017 3:58:09 PM This report has been signed electronically.

## 2017-06-17 NOTE — Progress Notes (Signed)
Called to room to assist during endoscopic procedure.  Patient ID and intended procedure confirmed with present staff. Received instructions for my participation in the procedure from the performing physician.  

## 2017-06-17 NOTE — Progress Notes (Signed)
To recovery, report to RN, VSS. 

## 2017-06-17 NOTE — Patient Instructions (Signed)
YOU HAD AN ENDOSCOPIC PROCEDURE TODAY AT [facility]:   Refer to the procedure report that was given to you for any specific questions about what was found during the examination.  If the procedure report does not answer your questions, please call your gastroenterologist to clarify.  If you requested that your care partner not be given the details of your procedure findings, then the procedure report has been included in a sealed envelope for you to review at your convenience later.  YOU SHOULD EXPECT: Some feelings of bloating in the abdomen. Passage of more gas than usual.  Walking can help get rid of the air that was put into your GI tract during the procedure and reduce the bloating. If you had a lower endoscopy (such as a colonoscopy or flexible sigmoidoscopy) you may notice spotting of blood in your stool or on the toilet paper. If you underwent a bowel prep for your procedure, you may not have a normal bowel movement for a few days.  Please Note:  You might notice some irritation and congestion in your nose or some drainage.  This is from the oxygen used during your procedure.  There is no need for concern and it should clear up in a day or so.  SYMPTOMS TO REPORT IMMEDIATELY:   Following lower endoscopy (colonoscopy or flexible sigmoidoscopy):  Excessive amounts of blood in the stool  Significant tenderness or worsening of abdominal pains  Swelling of the abdomen that is new, acute  Fever of 100F or higher   Following upper endoscopy (EGD)  Vomiting of blood or coffee ground material  New chest pain or pain under the shoulder blades  Painful or persistently difficult swallowing  New shortness of breath  Fever of 100F or higher  Black, tarry-looking stools  For urgent or emergent issues, a gastroenterologist can be reached at any hour by calling [phone].   DIET:  We do recommend a small meal at first, but then you may proceed to your regular diet.  Drink  plenty of fluids but you should avoid alcoholic beverages for 24 hours.  MEDICATIONS: Continue present medications. Treatment of Rifaximin 550 mg by mouth 3 times daily for 14 days for IBS-D. Rosanne Sack (Dr. Vena Rua office) notified and will contact specialty pharmacy.  Please see handouts given to you by your recovery nurse.  ACTIVITY:  You should plan to take it easy for the rest of today and you should NOT DRIVE or use heavy machinery until tomorrow (because of the sedation medicines used during the test).    FOLLOW UP: Our staff will call the number listed on your records the next business day following your procedure to check on you and address any questions or concerns that you may have regarding the information given to you following your procedure. If we do not reach you, we will leave a message.  However, if you are feeling well and you are not experiencing any problems, there is no need to return our call.  We will assume that you have returned to your regular daily activities without incident.  If any biopsies were taken you will be contacted by phone or by letter within the next 1-3 weeks.  Please call us at [phone] if you have not heard about the biopsies in 3 weeks.   Thank you for allowing [place] to provide for your healthcare needs today. SIGNATURES/CONFIDENTIALITY: You and/or your care partner have signed paperwork which will be entered into your electronic  medical record.  These signatures attest to the fact that that the information above on your After Visit Summary has been reviewed and is understood.  Full responsibility of the confidentiality of this discharge information lies with you and/or your care-partner.

## 2017-06-18 ENCOUNTER — Telehealth: Payer: Self-pay

## 2017-06-18 NOTE — Telephone Encounter (Signed)
  Follow up Call-  Call back number 06/17/2017  Post procedure Call Back phone  # [phone]  Permission to leave phone message Yes  Some recent data might be hidden     Patient questions:  Do you have a fever, pain , or abdominal swelling? No. Pain Score  0 *  Have you tolerated food without any problems? Yes.    Have you been able to return to your normal activities? Yes.    Do you have any questions about your discharge instructions: Diet   No. Medications  No. Follow up visit  No.  Do you have questions or concerns about your Care? No.  Actions: * If pain score is 4 or above: No action needed, pain <4.

## 2017-06-25 ENCOUNTER — Encounter: Payer: Self-pay | Admitting: Internal Medicine

## 2017-08-13 ENCOUNTER — Encounter: Payer: Self-pay | Admitting: *Deleted

## 2017-08-21 ENCOUNTER — Encounter: Payer: Self-pay | Admitting: Internal Medicine

## 2017-08-21 ENCOUNTER — Other Ambulatory Visit (INDEPENDENT_AMBULATORY_CARE_PROVIDER_SITE_OTHER): Payer: BLUE CROSS/BLUE SHIELD

## 2017-08-21 ENCOUNTER — Ambulatory Visit: Payer: BLUE CROSS/BLUE SHIELD | Admitting: Internal Medicine

## 2017-08-21 VITALS — BP 102/68 | HR 72 | Ht 60.0 in | Wt 148.4 lb

## 2017-08-21 DIAGNOSIS — K589 Irritable bowel syndrome without diarrhea: Secondary | ICD-10-CM

## 2017-08-21 DIAGNOSIS — R195 Other fecal abnormalities: Secondary | ICD-10-CM

## 2017-08-21 LAB — IGA: IgA: 249 mg/dL (ref 68–378)

## 2017-08-21 NOTE — Patient Instructions (Signed)
Your physician has requested that you go to the basement for the following lab work before leaving today: IgA, TTG  Please follow up with Dr Hilarie Fredrickson as needed.  If you are age 61 or older, your body mass index should be between 23-30. Your Body mass index is 28.98 kg/m. If this is out of the aforementioned range listed, please consider follow up with your Primary Care Provider.  If you are age 35 or younger, your body mass index should be between 19-25. Your Body mass index is 28.98 kg/m. If this is out of the aformentioned range listed, please consider follow up with your Primary Care Provider.

## 2017-08-21 NOTE — Progress Notes (Signed)
Subjective:    Patient ID: [name] Molina, female    DOB: July 06, 1956, 61 y.o.   MRN: [id number]  HPI [name] Molina is a 61 year old female with a past medical history of adenomatous colon polyp, hemorrhoids, IBS small hiatal hernia who is here for follow-up.  She was seen in the office on 06/03/2017 by Ellouise Newer, PA-C to evaluate abdominal pain, intermittent nausea and vomiting and rectal bleeding.  This led to an upper endoscopy and colonoscopy which were performed on 06/17/2017  Upper endoscopy was normal except for a small hiatal hernia. Colonoscopy revealed normal terminal ileum.  3 mm cecal adenoma which was removed.  Internal hemorrhoids.  She was tried on rifaximin 550 mg 3 times daily times 14 days.  She states she took this for 7 days and stop the medication because she developed nausea and just simply did not feel well while using this medication.  He did not help her abdominal crampy pain and loose stools.  She reports that she is not having diarrhea every day.  She is having episodic episodes of initially lower abdominal cramping followed by urgent loose stools.  This occurs roughly 2 days/week.  She feels that it is diet triggered but does not know any specific foods that cause these symptoms.  No blood in her stool or melena.  No further rectal bleeding.  No further nausea or vomiting.  Appetite has been good.  Review of Systems As per HPI, otherwise negative  Current Medications, Allergies, Past Medical History, Past Surgical History, Family History and Social History were reviewed in [employer] record.     Objective:   Physical Exam BP 102/68 (BP Location: Left Arm, Patient Position: Sitting, Cuff Size: Normal)   Pulse 72   Ht 5' (1.524 m) Comment: height measured without shoes  Wt 148 lb 6 oz (67.3 kg)   BMI 28.98 kg/m  Constitutional: Well-developed and well-nourished. No distress. HEENT: Normocephalic and atraumatic.   No scleral  icterus. Neck: Neck supple. Trachea midline. Cardiovascular: Normal rate, regular rhythm and intact distal pulses. No M/R/G Pulmonary/chest: Effort normal and breath sounds normal. No wheezing, rales or rhonchi. Abdominal: Soft, nontender, nondistended. Bowel sounds active throughout.  Extremities: no clubbing, cyanosis, or edema Neurological: Alert and oriented to person place and time. Skin: Skin is warm and dry. Psychiatric: Normal mood and affect. Behavior is normal.  CBC    Component Value Date/Time   WBC 4.8 06/08/2017 0958   RBC 4.37 06/08/2017 0958   HGB 13.2 06/08/2017 0958   HCT 40.8 06/08/2017 0958   PLT 183.0 06/08/2017 0958   MCV 93.5 06/08/2017 0958   MCH 29.4 12/15/2012 0545   MCHC 32.5 06/08/2017 0958   RDW 14.2 06/08/2017 0958   LYMPHSABS 1.4 06/08/2017 0958   MONOABS 0.5 06/08/2017 0958   EOSABS 0.0 06/08/2017 0958   BASOSABS 0.0 06/08/2017 0958   CMP     Component Value Date/Time   NA 143 06/08/2017 0958   K 3.9 06/08/2017 0958   CL 107 06/08/2017 0958   CO2 29 06/08/2017 0958   GLUCOSE 85 06/08/2017 0958   BUN 11 06/08/2017 0958   CREATININE 0.57 06/08/2017 0958   CALCIUM 9.1 06/08/2017 0958   PROT 6.7 06/08/2017 0958   ALBUMIN 4.0 06/08/2017 0958   AST 15 06/08/2017 0958   ALT 12 06/08/2017 0958   ALKPHOS 60 06/08/2017 0958   BILITOT 0.5 06/08/2017 0958    TTG pending     Assessment & Plan:  61 year old female with a past medical history of adenomatous colon polyp, hemorrhoids, IBS small hiatal hernia who is here for follow-up.  1. Episodic loose stools with lower abd cramping --this very well may be an irritable bowel type response.  We discussed possible dietary triggers.  She could try 3-4 weeks off of gluten and if no benefit then try an additional 3 or 4 weeks off of lactose and dairy products.  I will check her TTG today to exclude celiac disease.  The small bowel looked endoscopically normal but biopsies were not performed.  We  discussed medication therapy such as cholestyramine or colestipol but she prefers a nonmedicinal approach for now.  I let her know that she could contact me if symptoms become more bothersome and I would be willing to start colestipol and titrate the dose as needed.  She will follow-up as needed  2.  History of colon polyp --surveillance colonoscopy recommended in 5 years  15 minutes spent with the patient today. Greater than 50% was spent in counseling and coordination of care with the patient

## 2017-08-23 LAB — TISSUE TRANSGLUTAMINASE, IGA: (tTG) Ab, IgA: 1 U/mL

## 2018-03-21 IMAGING — US US BREAST LTD UNI LEFT INC AXILLA
1 series · 1 of 1 positions shown · non-contrast
Comparison: Recent screening study 01/30/2016 and earlier

CLINICAL DATA: Patient presents for evaluation palpable abnormality
in the left breast noted on recent physical exam.

EXAM:
2D DIGITAL DIAGNOSTIC LEFT MAMMOGRAM WITH ADJUNCT TOMO
ULTRASOUND LEFT BREAST

[Series 1: us breast ltd uni left inc axilla · 0.07mm/px · 1 of 1 slices shown]
[im 1/1]
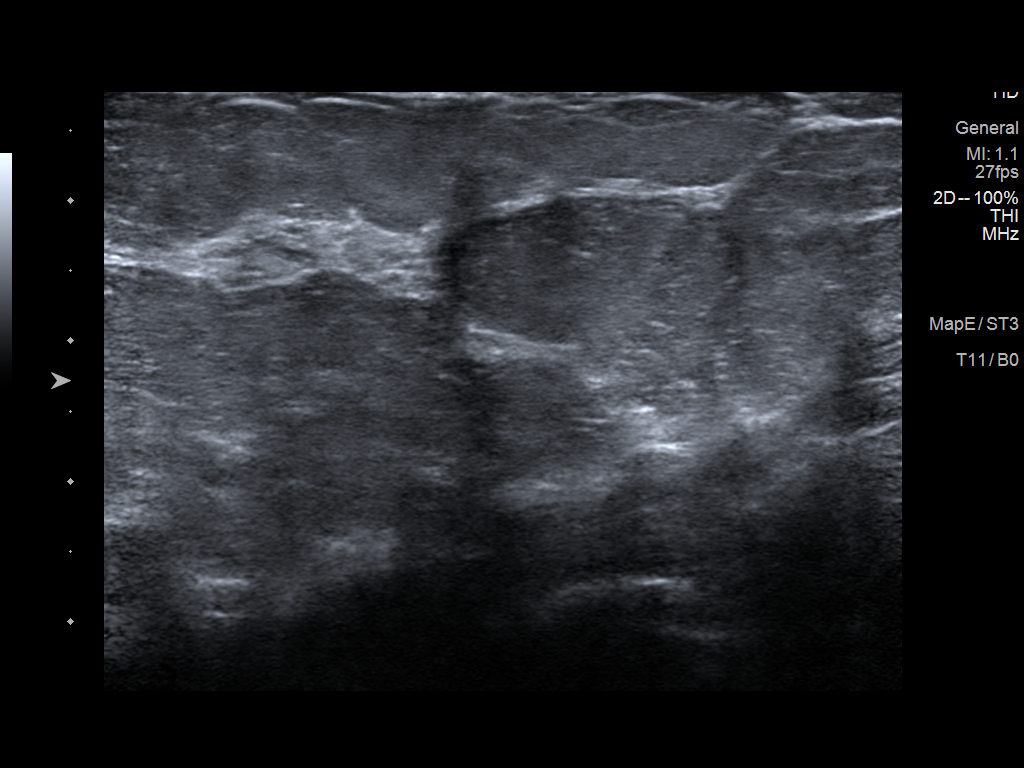

[1 of 1 positions shown; findings below may reference images not displayed]

ACR Breast Density Category c: The breast tissue is heterogeneously
dense, which may obscure small masses.
FINDINGS: No suspicious mass, distortion, or microcalcifications are
identified to suggest presence of malignancy.

On physical exam, I palpate soft thickening in the 6 o'clock
location of the left breast.

Targeted ultrasound is performed, showing normal appearing
fibroglandular tissue throughout the lower quadrants of the left
breast. No mass, distortion, or acoustic shadowing is demonstrated
with ultrasound.
IMPRESSION: No mammographic or ultrasound evidence for malignancy.

RECOMMENDATION:
Screening mammogram in one year.(Code:[id number])

I have discussed the findings and recommendations with the patient.
Results were also provided in writing at the conclusion of the
visit. If applicable, a reminder letter will be sent to the patient
regarding the next appointment.

BI-RADS CATEGORY  1: Negative.

## 2020-01-17 ENCOUNTER — Encounter: Payer: Self-pay | Admitting: Physician Assistant

## 2020-02-02 ENCOUNTER — Other Ambulatory Visit (INDEPENDENT_AMBULATORY_CARE_PROVIDER_SITE_OTHER): Payer: BC Managed Care – PPO

## 2020-02-02 ENCOUNTER — Encounter: Payer: Self-pay | Admitting: Physician Assistant

## 2020-02-02 ENCOUNTER — Ambulatory Visit: Payer: BC Managed Care – PPO | Admitting: Physician Assistant

## 2020-02-02 VITALS — BP 102/71 | HR 86 | Ht 61.0 in | Wt 152.0 lb

## 2020-02-02 DIAGNOSIS — R194 Change in bowel habit: Secondary | ICD-10-CM | POA: Diagnosis not present

## 2020-02-02 DIAGNOSIS — R635 Abnormal weight gain: Secondary | ICD-10-CM

## 2020-02-02 DIAGNOSIS — R1013 Epigastric pain: Secondary | ICD-10-CM

## 2020-02-02 LAB — BASIC METABOLIC PANEL
BUN: 14 mg/dL (ref 6–23)
CO2: 30 mEq/L (ref 19–32)
Calcium: 9.3 mg/dL (ref 8.4–10.5)
Chloride: 102 mEq/L (ref 96–112)
Creatinine, Ser: 0.86 mg/dL (ref 0.40–1.20)
GFR: 66.49 mL/min (ref >60.00)
Glucose, Bld: 92 mg/dL (ref 70–99)
Potassium: 3.8 mEq/L (ref 3.5–5.1)
Sodium: 137 mEq/L (ref 135–145)

## 2020-02-02 NOTE — Patient Instructions (Signed)
If you are age 64 or older, your body mass index should be between 23-30. Your Body mass index is 28.72 kg/m. If this is out of the aforementioned range listed, please consider follow up with your Primary Care Provider.  If you are age 8 or younger, your body mass index should be between 19-25. Your Body mass index is 28.72 kg/m. If this is out of the aformentioned range listed, please consider follow up with your Primary Care Provider.   Your provider has requested that you go to the basement level for lab work before leaving today. Press "B" on the elevator. The lab is located at the first door on the left as you exit the elevator.  Due to recent changes in healthcare laws, you may see the results of your imaging and laboratory studies on MyChart before your provider has had a chance to review them.  We understand that in some cases there may be results that are confusing or concerning to you. Not all laboratory results come back in the same time frame and the provider may be waiting for multiple results in order to interpret others.  Please give [place] 48 hours in order for your provider to thoroughly review all the results before contacting the office for clarification of your results.   You have been scheduled for a CT scan of the abdomen and pelvis at [facility] ([address]---this is in the same building as [employer]).   You are scheduled on 02/07/20 at 1:00 pm. You should arrive 15 minutes prior to your appointment time for registration. Please follow the written instructions below on the day of your exam:  WARNING: IF YOU ARE ALLERGIC TO IODINE/X-RAY DYE, PLEASE NOTIFY RADIOLOGY IMMEDIATELY AT [phone]! YOU WILL BE GIVEN A 13 HOUR PREMEDICATION PREP.  1) Do not eat or drink anything after 9:00 am (4 hours prior to your test) 2) You have been given 2 bottles of oral contrast to drink. The solution may taste better if refrigerated, but do NOT add ice or any  other liquid to this solution. Shake well before drinking.    Drink 1 bottle of contrast @ 11:00 am (2 hours prior to your exam)  Drink 1 bottle of contrast @ 12:00 pm (1 hour prior to your exam)  You may take any medications as prescribed with a small amount of water, if necessary. If you take any of the following medications: METFORMIN, GLUCOPHAGE, GLUCOVANCE, AVANDAMET, RIOMET, FORTAMET, [place] MET, JANUMET, GLUMETZA or METAGLIP, you MAY be asked to HOLD this medication 48 hours AFTER the exam.  The purpose of you drinking the oral contrast is to aid in the visualization of your intestinal tract. The contrast solution may cause some diarrhea. Depending on your individual set of symptoms, you may also receive an intravenous injection of x-ray contrast/dye. Plan on being at [facility] for 30 minutes or longer, depending on the type of exam you are having performed.  This test typically takes 30-45 minutes to complete.  If you have any questions regarding your exam or if you need to reschedule, you may call the CT department at [phone] between the hours of 8:00 am and 5:00 pm, Monday-Friday.  ________________________________________________________________________  Follow up pending the results of your CT.

## 2020-02-02 NOTE — Progress Notes (Signed)
Chief Complaint: Abdominal pain and diarrhea  HPI:    [name] Molina is a 64 year old female with a past medical history as listed below including adenomatous colon polyp, hemorrhoids, IBS, small hiatal hernia and others, known Dr. Hilarie Fredrickson, who was referred to me by Nena Polio, NP for a complaint of abdominal pain and diarrhea.      06/17/2017 EGD and colonoscopy.  EGD normal except for small hiatal hernia.  Colonoscopy revealed normal terminal ileum.  3 mm cecal adenoma which was removed and internal hemorrhoids.    08/21/2017 patient seen by Dr. Hilarie Fredrickson for diarrhea.   That time she had previously been tried on rifaximin 550 3 times daily x14 days.  She took it for 7 days and stopped it due to nausea and just simply did not feel well while using it.  It did not help her abdominal crampy pain and loose stools.  At that time reported episodes of lower abdominal cramping followed by urgent loose stools 2 days a week.  We discussed that this may be irritable bowel syndrome.  Dietary triggers were discussed.  Celiac disease was excluded.  Medication therapies including cholestyramine or colestipol were discussed but she preferred nonmedicinal.  Is recommended she try colestipol in the future if needed.  Surveillance colonoscopy recommended in 5 years.    12/15/2019 CBC and CMP normal.    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  Explains that her mother died of these floating tumors because they were cancerous, "mine were not".  Tells me that over the past month she has felt an epigastric pressure describing this like being 7 months pregnant and feeling like something is "pushing out my ribs".  Tells me it is uncomfortable to sit.  If she lies down she gets more comfortable.   She has gained around 10 pounds during this time as well with no real changes in diet.  Also describes some fatigue.  Denies any heartburn or reflux and symptoms are no worse after eating.    Has also had a change in stools to looser more often than not, tells me she cannot hardly have any solid stools.  She can have one or maybe even sometimes 3 to 4 a day.  Does note some dietary triggers.    Denies fever, chills, blood in her stool or symptoms that awaken her from sleep.  Past Medical History:  Diagnosis Date  . Anemia   . Anxiety   . Depression   . Headache(784.0)   . Hemorrhoids 11-15-2007  . Hiatal hernia   . IBS (irritable bowel syndrome)   . Internal hemorrhoids   . Osteoporosis   . Thyroid disease   . Tubular adenoma of colon     Past Surgical History:  Procedure Laterality Date  . ABDOMINAL HYSTERECTOMY    . COLONOSCOPY  11/15/2007   Dr. Sharlett Iles   . INGUINAL HERNIA REPAIR    . LAPAROSCOPY N/A 12/14/2012   Procedure: LAPAROSCOPY OPERATIVE;  Surgeon: Maisie Fus, MD;  Location: [facility];  Service: Gynecology;  Laterality: N/A;  with Bilateral Ovarian Cystectomies  . ovaries removed    . TONSILLECTOMY      Current Outpatient Medications  Medication Sig Dispense Refill  . estradiol (VIVELLE-DOT) 0.05 MG/24HR Place  1 patch onto the skin once a week.    . levothyroxine (SYNTHROID, LEVOTHROID) 88 MCG tablet Take 88 mcg by mouth daily.    . SUMAtriptan (IMITREX) 100 MG tablet Take 1 tablet by mouth as needed.     Current Facility-Administered Medications  Medication Dose Route Frequency Provider Last Rate Last Admin  . 0.9 %  sodium chloride infusion  500 mL Intravenous Continuous Pyrtle, Lajuan Lines, MD        Allergies as of 02/02/2020 - Review Complete 08/21/2017  Allergen Reaction Noted  . Morphine and related Nausea And Vomiting 01/12/2013  . Benadryl [diphenhydramine hcl] Other (See Comments) 10/19/2012  . Codeine Nausea Only 10/19/2012    Family History  Problem  Relation Age of Onset  . Cancer Mother   . Heart disease Father   . Prostate cancer Brother   . Colon cancer Neg Hx   . Esophageal cancer Neg Hx   . Rectal cancer Neg Hx   . Stomach cancer Neg Hx     Social History   Socioeconomic History  . Marital status: Married    Spouse name: Not on file  . Number of children: 2  . Years of education: Not on file  . Highest education level: Not on file  Occupational History  . Not on file  Tobacco Use  . Smoking status: Never Smoker  . Smokeless tobacco: Never Used  Substance and Sexual Activity  . Alcohol use: Yes    Alcohol/week: 3.0 standard drinks    Types: 3 Glasses of wine per week    Comment: 2-3 glasses of wine through out the week  . Drug use: No  . Sexual activity: Never  Other Topics Concern  . Not on file  Social History Narrative  . Not on file   Social Determinants of Health   Financial Resource Strain:   . Difficulty of Paying Living Expenses:   Food Insecurity:   . Worried About Charity fundraiser in the Last Year:   . Arboriculturist in the Last Year:   Transportation Needs:   . Film/video editor (Medical):   Marland Kitchen Lack of Transportation (Non-Medical):   Physical Activity:   . Days of Exercise per Week:   . Minutes of Exercise per Session:   Stress:   . Feeling of Stress :   Social Connections:   . Frequency of Communication with Friends and Family:   . Frequency of Social Gatherings with Friends and Family:   . Attends Religious Services:   . Active Member of Clubs or Organizations:   . Attends Archivist Meetings:   Marland Kitchen Marital Status:   Intimate Partner Violence:   . Fear of Current or Ex-Partner:   . Emotionally Abused:   Marland Kitchen Physically Abused:   . Sexually Abused:     Review of Systems:    Constitutional: No weight loss, fever or chills Cardiovascular: No chest pain  Respiratory: No SOB  Gastrointestinal: See HPI and otherwise negative   Physical Exam:  Vital signs: BP 102/71    Pulse 86   Ht 5\' 1"  (1.549 m)   Wt 152 lb (68.9 kg)   BMI 28.72 kg/m   Constitutional:   Pleasant Caucasian female appears to be in NAD, Well developed, Well nourished, alert and cooperative Respiratory: Respirations even and unlabored. Lungs clear to auscultation bilaterally.   No wheezes, crackles, or rhonchi.  Cardiovascular: Normal S1, S2. No MRG. Regular rate and rhythm. No peripheral edema,  cyanosis or pallor.  Gastrointestinal:  Soft, nondistended, mild epigastric ttp. No rebound or guarding. Normal bowel sounds. No appreciable masses or hepatomegaly. Rectal:  Not performed.  Psychiatric: Demonstrates good judgement and reason without abnormal affect or behaviors.  See HPI for recent labs.  Assessment: 1.  Epigastric pressure: Describes pressure like a baby is pushing out her ribs, more discomfort when sitting, less when laying down, no change with eating, no change in heartburn or reflux, history of "floating tumors"; concern for tumor versus gastritis versus other 2.  Change in bowel habits: History of loose stools and IBS, these loose stools have become more frequent, patient is able to decrease them by dietary changes 3.  Weight gain: 10 # over the past year or so  Plan: 1.  Due to patient's description of tumor history ordered a CT of the abdomen and pelvis for further evaluation of this epigastric pressure and change in bowel habits. 2.  Ordered BMP to ensure contrast is safe. 3.  Pending results from above could discuss an EGD as next step in eval 4.  Patient to follow in clinic per recommendations after CT abdomen/pelvis  Ellouise Newer, PA-C [facility] 02/02/2020, 3:04 PM  Cc: Nena Polio, NP

## 2020-02-07 ENCOUNTER — Other Ambulatory Visit: Payer: Self-pay

## 2020-02-07 ENCOUNTER — Ambulatory Visit (INDEPENDENT_AMBULATORY_CARE_PROVIDER_SITE_OTHER)
Admission: RE | Admit: 2020-02-07 | Discharge: 2020-02-07 | Disposition: A | Discharge status: Home / Self Care | Payer: BC Managed Care – PPO | Source: Ambulatory Visit | Attending: Physician Assistant | Admitting: Physician Assistant

## 2020-02-07 DIAGNOSIS — R1013 Epigastric pain: Secondary | ICD-10-CM

## 2020-02-07 DIAGNOSIS — R635 Abnormal weight gain: Secondary | ICD-10-CM

## 2020-02-07 DIAGNOSIS — R194 Change in bowel habit: Secondary | ICD-10-CM

## 2020-02-07 MED ORDER — IOHEXOL 300 MG/ML  SOLN
100.0000 mL | Freq: Once | INTRAMUSCULAR | Status: AC | PRN
  Administered 2020-02-07: 100 mL via INTRAVENOUS

## 2020-02-07 NOTE — Progress Notes (Signed)
Addendum: Reviewed and agree with assessment and management plan. Gloriann Riede M, MD  

## 2022-03-22 IMAGING — CT CT ABD-PELV W/ CM
2 of 5 series · 16 of 46 positions shown, 18 images · IV contrast (OMNIPAQUE 300)
Comparison: July 15, 2013

CLINICAL DATA: Epigastric pain.

EXAM:
CT ABDOMEN AND PELVIS WITH CONTRAST
TECHNIQUE: Multidetector CT imaging of the abdomen and pelvis was performed
using the standard protocol following bolus administration of
intravenous contrast.
CONTRAST:  100mL OMNIPAQUE IOHEXOL 300 MG/ML  SOLN

[Series 2: abd/pel w · axial · 0.62mm/px · z∈[-470,-85]mm · 13 of 87 slices shown, 15 images]
[im 5/87  soft-tissue]
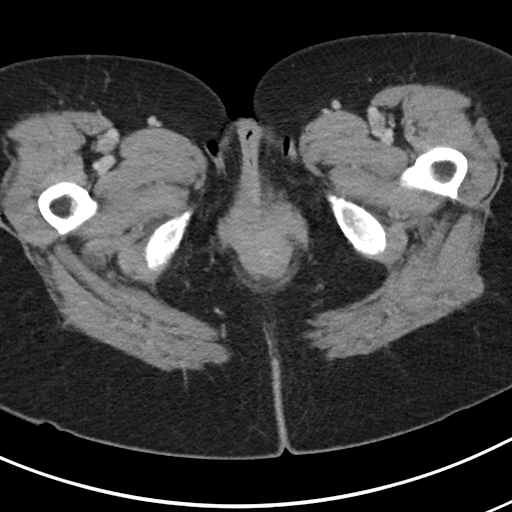
[im 5/87  bone]
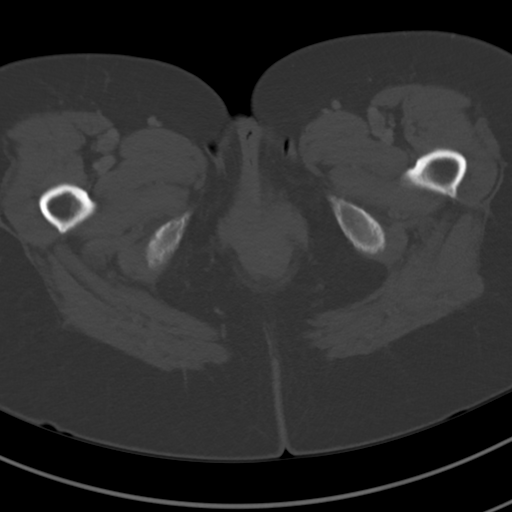
[im 10/87  soft-tissue]
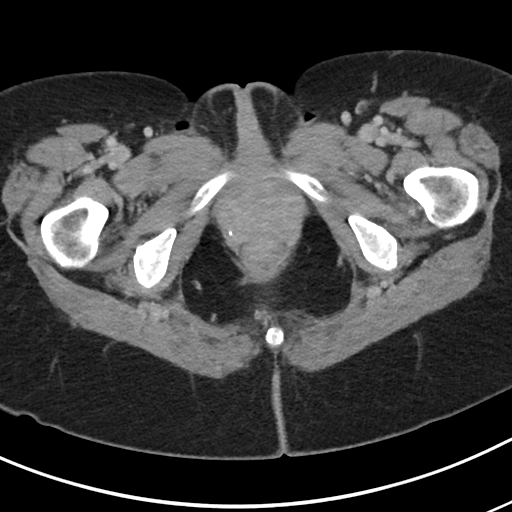
[im 20/87  soft-tissue]
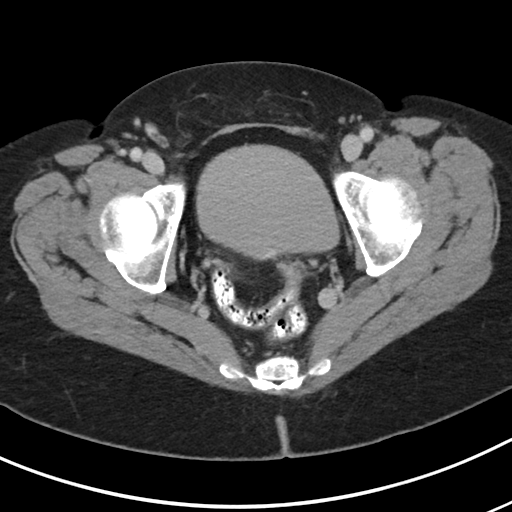
[im 24/87  soft-tissue]
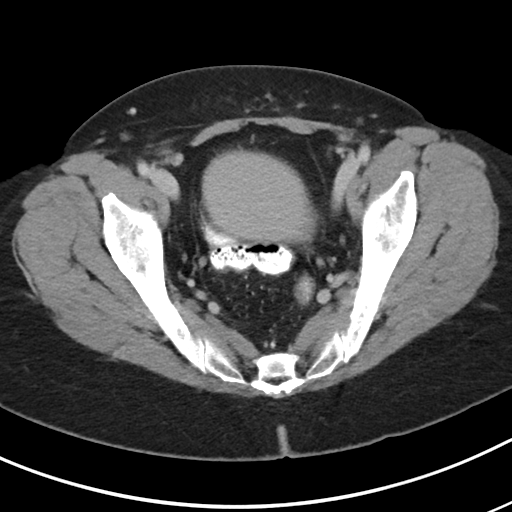
[im 29/87  soft-tissue]
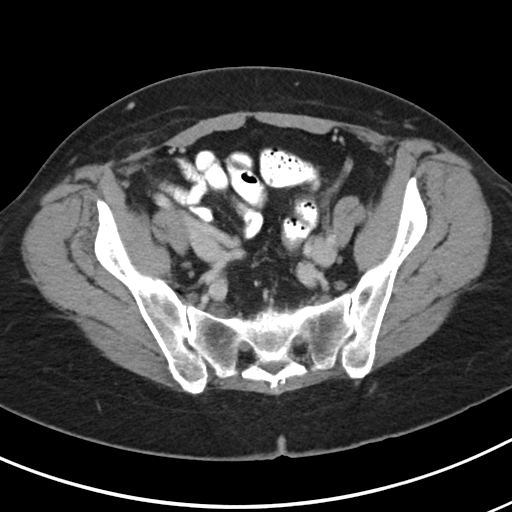
[im 39/87  soft-tissue]
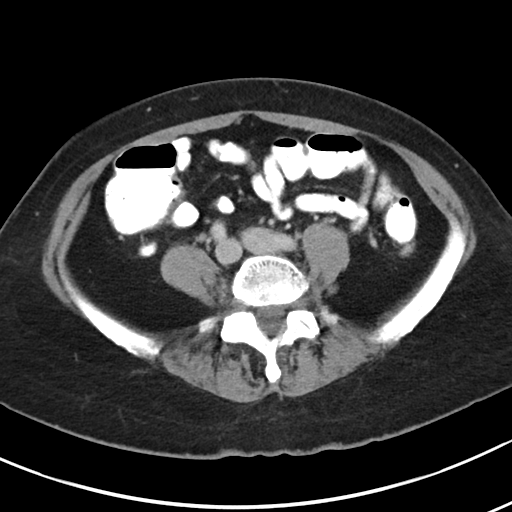
[im 44/87  soft-tissue]
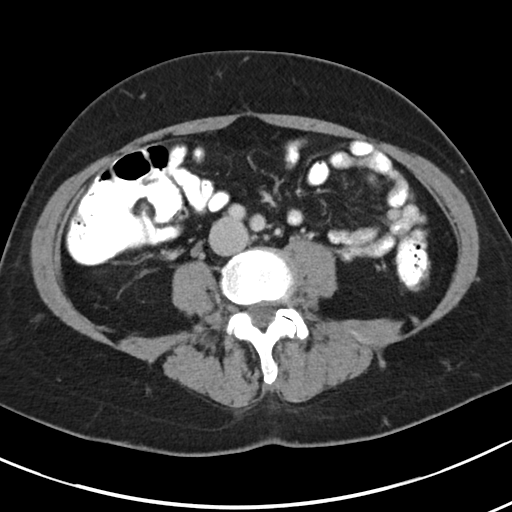
[im 48/87  soft-tissue]
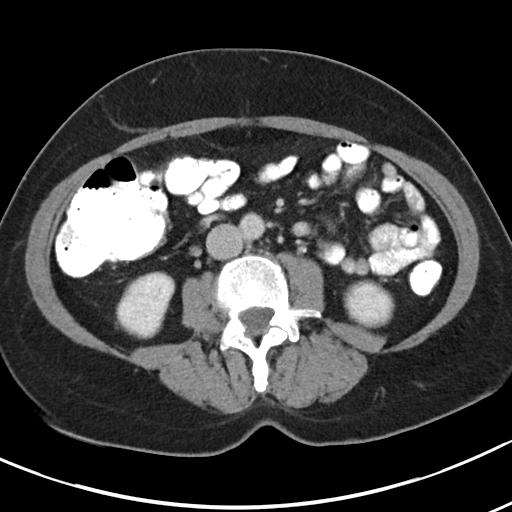
[im 58/87  soft-tissue]
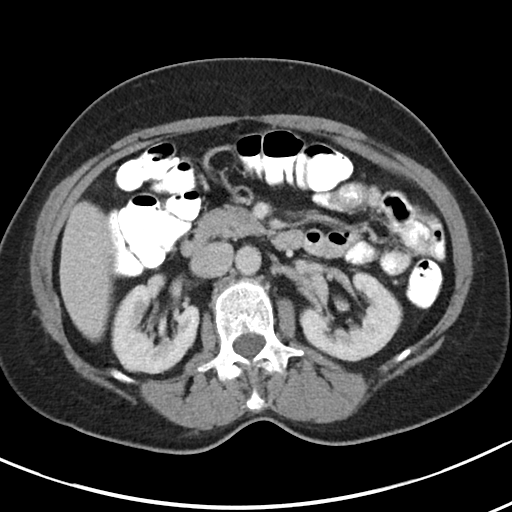
[im 58/87  bone]
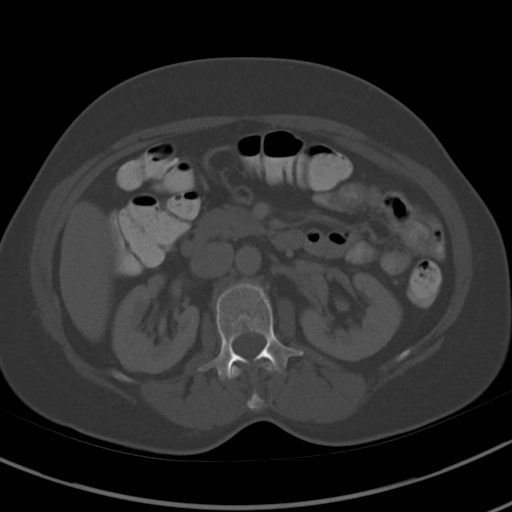
[im 63/87  soft-tissue]
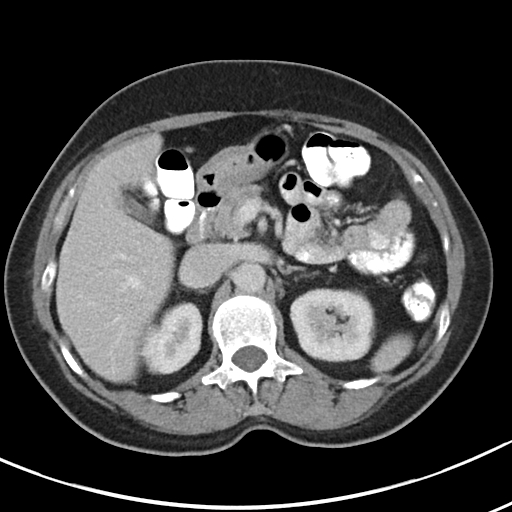
[im 67/87  soft-tissue]
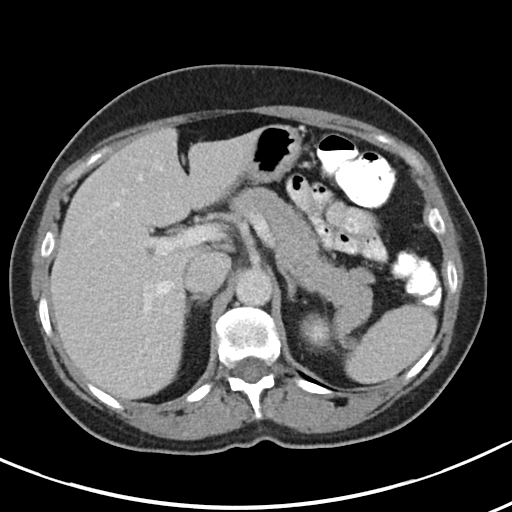
[im 77/87  soft-tissue]
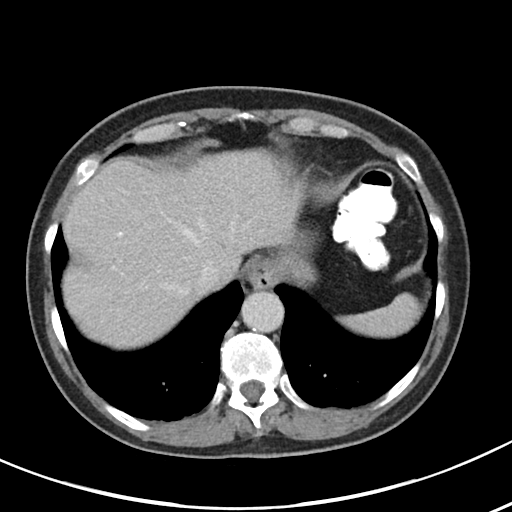
[im 82/87  soft-tissue]
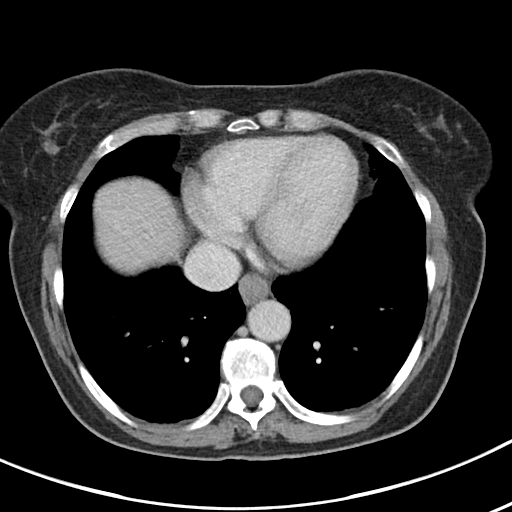

[Series 5: abd/pel w st · coronal · 0.62mm/px · 3 of 72 slices shown]
[im 24/72  soft-tissue]
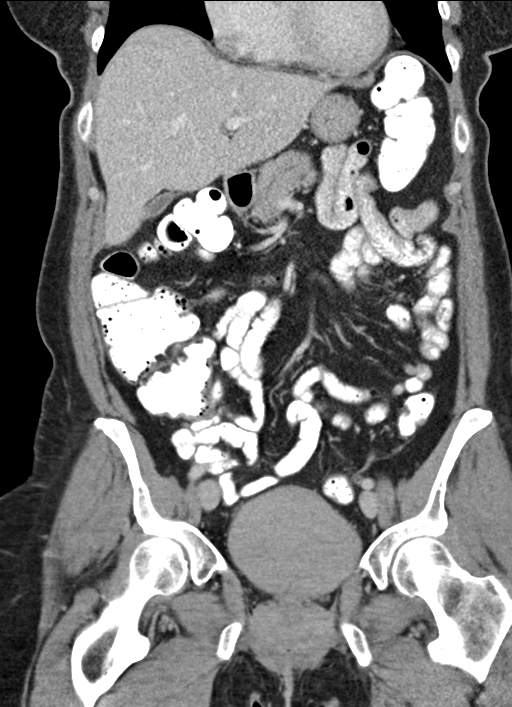
[im 32/72  soft-tissue]
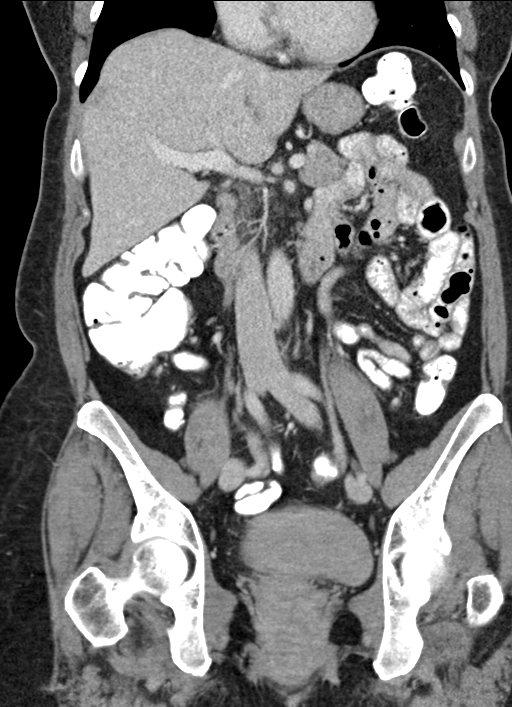
[im 40/72  soft-tissue]
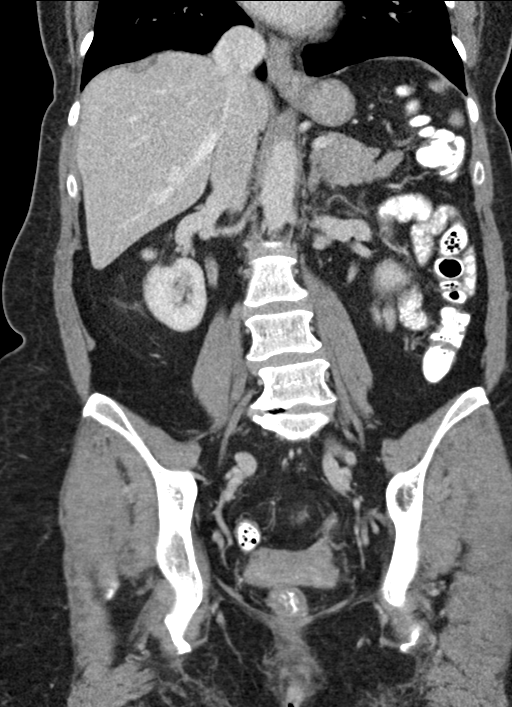

[16 of 46 positions shown; findings below may reference images not displayed]

FINDINGS: Lower chest: Stable 4 mm and 5 mm noncalcified lung nodules are seen
within the posterior aspect of the right lung base.

Hepatobiliary: No focal liver abnormality is seen. No gallstones,
gallbladder wall thickening, or biliary dilatation.

Pancreas: Unremarkable. No pancreatic ductal dilatation or
surrounding inflammatory changes.

Spleen: Normal in size without focal abnormality.

Adrenals/Urinary Tract: Adrenal glands are unremarkable. Kidneys are
normal, without renal calculi, focal lesion, or hydronephrosis.
Bladder is unremarkable.

Stomach/Bowel: There is a very small hiatal hernia. The appendix is
not identified. No evidence of bowel wall thickening, distention, or
inflammatory changes.

Vascular/Lymphatic: No significant vascular findings are present. No
enlarged abdominal or pelvic lymph nodes.

Reproductive: Uterus and bilateral adnexa are unremarkable.

Other: No abdominal wall hernia or abnormality. No abdominopelvic
ascites.

Musculoskeletal: Mild to moderate severity degenerative changes seen
at the level of L5-S1.
IMPRESSION: 1. Very small hiatal hernia.
2. Stable 4 mm and 5 mm noncalcified lung nodules within the
posterior aspect of the right lung base.

## 2022-07-09 ENCOUNTER — Encounter: Payer: Self-pay | Admitting: Internal Medicine

## 2022-07-29 ENCOUNTER — Encounter: Payer: Self-pay | Admitting: Internal Medicine

## 2022-08-18 ENCOUNTER — Other Ambulatory Visit: Payer: Self-pay | Admitting: Obstetrics and Gynecology

## 2022-08-18 DIAGNOSIS — R928 Other abnormal and inconclusive findings on diagnostic imaging of breast: Secondary | ICD-10-CM

## 2022-09-01 ENCOUNTER — Ambulatory Visit (AMBULATORY_SURGERY_CENTER): Payer: Self-pay | Admitting: *Deleted

## 2022-09-01 VITALS — Ht 61.0 in | Wt 139.0 lb

## 2022-09-01 DIAGNOSIS — Z86010 Personal history of colonic polyps: Secondary | ICD-10-CM

## 2022-09-01 MED ORDER — NA SULFATE-K SULFATE-MG SULF 17.5-3.13-1.6 GM/177ML PO SOLN: 1.0000 | Freq: Once | ORAL | 0 refills | Status: AC

## 2022-09-01 NOTE — Progress Notes (Signed)

## 2022-09-21 ENCOUNTER — Emergency Department (HOSPITAL_COMMUNITY): Payer: Medicare Other

## 2022-09-21 ENCOUNTER — Other Ambulatory Visit: Payer: Self-pay

## 2022-09-21 ENCOUNTER — Encounter (HOSPITAL_COMMUNITY): Payer: Self-pay | Admitting: Pharmacy Technician

## 2022-09-21 ENCOUNTER — Emergency Department (HOSPITAL_COMMUNITY)
Admission: EM | Admit: 2022-09-21 | Discharge: 2022-09-21 | Disposition: A | Discharge status: Home / Self Care | Payer: Medicare Other | Attending: Emergency Medicine | Admitting: Emergency Medicine

## 2022-09-21 DIAGNOSIS — M545 Low back pain, unspecified: Secondary | ICD-10-CM | POA: Insufficient documentation

## 2022-09-21 DIAGNOSIS — R1032 Left lower quadrant pain: Secondary | ICD-10-CM | POA: Diagnosis not present

## 2022-09-21 DIAGNOSIS — Z20822 Contact with and (suspected) exposure to covid-19: Secondary | ICD-10-CM | POA: Diagnosis not present

## 2022-09-21 DIAGNOSIS — R197 Diarrhea, unspecified: Secondary | ICD-10-CM | POA: Insufficient documentation

## 2022-09-21 LAB — CBC WITH DIFFERENTIAL/PLATELET
Abs Immature Granulocytes: 0.01 10*3/uL (ref 0.00–0.07)
Basophils Absolute: 0 10*3/uL (ref 0.0–0.1)
Basophils Relative: 0 %
Eosinophils Absolute: 0 10*3/uL (ref 0.0–0.5)
Eosinophils Relative: 0 %
HCT: 41.5 % (ref 36.0–46.0)
Hemoglobin: 13.4 g/dL (ref 12.0–15.0)
Immature Granulocytes: 0 %
Lymphocytes Relative: 34 %
Lymphs Abs: 1.6 10*3/uL (ref 0.7–4.0)
MCH: 29.6 pg (ref 26.0–34.0)
MCHC: 32.3 g/dL (ref 30.0–36.0)
MCV: 91.6 fL (ref 80.0–100.0)
Monocytes Absolute: 0.8 10*3/uL (ref 0.1–1.0)
Monocytes Relative: 17 %
Neutro Abs: 2.2 10*3/uL (ref 1.7–7.7)
Neutrophils Relative %: 49 %
Platelets: 196 10*3/uL (ref 150–400)
RBC: 4.53 MIL/uL (ref 3.87–5.11)
RDW: 14.7 % (ref 11.5–15.5)
WBC: 4.5 10*3/uL (ref 4.0–10.5)
nRBC: 0 % (ref 0.0–0.2)

## 2022-09-21 LAB — COMPREHENSIVE METABOLIC PANEL
ALT: 17 U/L (ref 0–44)
AST: 23 U/L (ref 15–41)
Albumin: 3.9 g/dL (ref 3.5–5.0)
Alkaline Phosphatase: 60 U/L (ref 38–126)
Anion gap: 9 (ref 5–15)
BUN: 9 mg/dL (ref 8–23)
CO2: 24 mmol/L (ref 22–32)
Calcium: 9.2 mg/dL (ref 8.9–10.3)
Chloride: 104 mmol/L (ref 98–111)
Creatinine, Ser: 0.73 mg/dL (ref 0.44–1.00)
GFR, Estimated: >60 mL/min (ref >60)
Glucose, Bld: 88 mg/dL (ref 70–99)
Potassium: 3.8 mmol/L (ref 3.5–5.1)
Sodium: 137 mmol/L (ref 135–145)
Total Bilirubin: 0.5 mg/dL (ref 0.3–1.2)
Total Protein: 7.2 g/dL (ref 6.5–8.1)

## 2022-09-21 LAB — RESP PANEL BY RT-PCR (RSV, FLU A&B, COVID)  RVPGX2
Influenza A by PCR: NEGATIVE
Influenza B by PCR: NEGATIVE
Resp Syncytial Virus by PCR: NEGATIVE
SARS Coronavirus 2 by RT PCR: NEGATIVE

## 2022-09-21 LAB — I-STAT CHEM 8, ED
BUN: 9 mg/dL (ref 8–23)
Calcium, Ion: 1.12 mmol/L — ABNORMAL LOW (ref 1.15–1.40)
Chloride: 103 mmol/L (ref 98–111)
Creatinine, Ser: 0.5 mg/dL (ref 0.44–1.00)
Glucose, Bld: 87 mg/dL (ref 70–99)
HCT: 42 % (ref 36.0–46.0)
Hemoglobin: 14.3 g/dL (ref 12.0–15.0)
Potassium: 3.8 mmol/L (ref 3.5–5.1)
Sodium: 139 mmol/L (ref 135–145)
TCO2: 25 mmol/L (ref 22–32)

## 2022-09-21 LAB — URINALYSIS, ROUTINE W REFLEX MICROSCOPIC
Bilirubin Urine: NEGATIVE
Glucose, UA: NEGATIVE mg/dL
Hgb urine dipstick: NEGATIVE
Ketones, ur: 20 mg/dL — AB
Leukocytes,Ua: NEGATIVE
Nitrite: NEGATIVE
Protein, ur: NEGATIVE mg/dL
Specific Gravity, Urine: >1.046 — ABNORMAL HIGH (ref 1.005–1.030)
pH: 6 (ref 5.0–8.0)

## 2022-09-21 LAB — LIPASE, BLOOD: Lipase: 36 U/L (ref 11–51)

## 2022-09-21 MED ORDER — METHOCARBAMOL 1000 MG/10ML IJ SOLN
1000.0000 mg | Freq: Once | INTRAVENOUS | Status: AC
  Administered 2022-09-21: 1000 mg via INTRAVENOUS
  Filled 2022-09-21: qty 10

## 2022-09-21 MED ORDER — TIZANIDINE HCL 2 MG PO CAPS: 2.0000 mg | ORAL_CAPSULE | Freq: Three times a day (TID) | ORAL | 0 refills | Status: DC | PRN

## 2022-09-21 MED ORDER — METHOCARBAMOL 1000 MG/10ML IJ SOLN: 1000.0000 mg | Freq: Once | INTRAMUSCULAR | Status: DC

## 2022-09-21 MED ORDER — IOHEXOL 350 MG/ML SOLN
75.0000 mL | Freq: Once | INTRAVENOUS | Status: AC | PRN
  Administered 2022-09-21: 75 mL via INTRAVENOUS

## 2022-09-21 MED ORDER — NAPROXEN 375 MG PO TABS: 375.0000 mg | ORAL_TABLET | Freq: Two times a day (BID) | ORAL | 0 refills | Status: DC

## 2022-09-21 MED ORDER — FENTANYL CITRATE PF 50 MCG/ML IJ SOSY
50.0000 ug | PREFILLED_SYRINGE | Freq: Once | INTRAMUSCULAR | Status: AC
  Administered 2022-09-21: 50 ug via INTRAVENOUS
  Filled 2022-09-21: qty 1

## 2022-09-21 MED ORDER — KETOROLAC TROMETHAMINE 15 MG/ML IJ SOLN
15.0000 mg | Freq: Once | INTRAMUSCULAR | Status: AC
  Administered 2022-09-21: 15 mg via INTRAVENOUS
  Filled 2022-09-21: qty 1

## 2022-09-21 MED ORDER — SODIUM CHLORIDE 0.9 % IV BOLUS
1000.0000 mL | Freq: Once | INTRAVENOUS | Status: AC
  Administered 2022-09-21: 1000 mL via INTRAVENOUS

## 2022-09-21 NOTE — ED Triage Notes (Signed)
Pt here with L back pain and L abdominal tenderness onset yesterday.

## 2022-09-21 NOTE — ED Provider Notes (Signed)
Assume Care - Medical Decision Making  Care of patient assumed from previous emergency medicine provider. See their note for further details of history, physical exam and plan.  Briefly, [name] Molina is a 67 y.o. female who presents with: Multiple episodes of nonbloody diarrhea today after getting smoothies with her granddaughter yesterday.  While sitting on the toilet yesterday she developed severe left lower back pain that did not radiate.  Pain did not improve today.  Never really had back pain before.  On exam had left abdominal pain in the lower quadrant and left paraspinal muscle tenderness.  So far workup has been unremarkable, normal labs, CT of the abdomen pelvis and lumbar spine all normal and no etiology is to her symptoms.  She does have evidence of diverticulosis but no evidence of diverticulitis.  Follow-up urinalysis to rule out cystitis/pyelonephritis.  If normal can discharge home with musculoskeletal back pain medications and information.   Reassessment:  I personally reassessed the patient: Vital Signs:  The most current vitals were  Vitals:   09/21/22 1315 09/21/22 1340  BP: 118/68   Pulse: 73   Resp: (!) 21   Temp:  (!) 97.4 F (36.3 C)  SpO2: 100%    Hemodynamics:  The patient is hemodynamically stable. Mental Status:  The patient is alert   Additional MDM/ED Course: Patient's urinalysis shows no signs of UTI.  COVID and flu is negative.  Reviewed labs which all look medical stability unremarkable.  CT abdomen pelvis is also unremarkable and no etiology as to her pain.  She likely has back pain and musculoskeletal cause.  Had resolution with fentanyl, but pain is starting to come back.  She was given 15 mg of IV Toradol and 1000 mg of IV Robaxin.  Discharged with a prescription for 375 mg twice daily naproxen and 2 mg 3 times daily as needed Zanaflex.  Instructed to follow-up with her primary care doctor, she has an appointment on Tuesday in 2 days.stable for  discharge home with outpatient follow-up and evaluation including physical therapy.      Phyllis Ginger, MD 09/21/22 [place], [place], DO 09/21/22 2314

## 2022-09-21 NOTE — ED Notes (Signed)
Attempted to draw PT blood. Very tearful before and during blood draw and saying stop.

## 2022-09-21 NOTE — ED Provider Notes (Signed)
[facility] Provider Note   CSN: [id number] Arrival date & time: 09/21/22  9381     History  Chief Complaint  Patient presents with   Back Pain    [name] Molina is a 67 y.o. female history includes IBS, osteoporosis, thyroid disease.  Patient reports she was in her normal state of health yesterday taking care of her granddaughter and they went to go get some smoothies. Shortly after eating the smoothies patient developed diarrhea she reports several episodes of nonbloody diarrhea. While she was sitting on the toilet she developed severe left lower back pain that does not radiate. This pain has been constant she tried to lie down to help with the pain. When she got up this morning pain worsened. She denies history of chronic back pain.   No numbness, tingling, saddle paresthesias, incontinence, radiation of pain, fever, chills, chest pain/shortness of breath or any other concerns.  HPI     Home Medications Prior to Admission medications   Medication Sig Start Date End Date Taking? Authorizing Provider  estradiol (VIVELLE-DOT) 0.05 MG/24HR Place 1 patch onto the skin once a week.    [provider]  SUMAtriptan (IMITREX) 100 MG tablet Take 1 tablet by mouth as needed. Patient not taking: Reported on 09/01/2022 05/15/17   [provider]  SYNTHROID 100 MCG tablet Take 1 tablet by mouth daily. 06/17/22   [provider]      Allergies    Morphine and related, Benadryl [diphenhydramine hcl], and Codeine    Review of Systems   Review of Systems Ten systems are reviewed and are negative for acute change except as noted in the HPI  Physical Exam Updated Vital Signs BP 118/68   Pulse 73   Temp (!) 97.4 F (36.3 C) (Oral)   Resp (!) 21   SpO2 100%  Physical Exam Constitutional:      General: She is not in acute distress.    Appearance: Normal appearance. She is well-developed. She is not ill-appearing or  diaphoretic.  HENT:     Head: Normocephalic and atraumatic.  Eyes:     General: Vision grossly intact. Gaze aligned appropriately.     Pupils: Pupils are equal, round, and reactive to light.  Neck:     Trachea: Trachea and phonation normal.  Cardiovascular:     Rate and Rhythm: Normal rate and regular rhythm.     Pulses:          Dorsalis pedis pulses are 2+ on the right side and 2+ on the left side.  Pulmonary:     Effort: Pulmonary effort is normal. No respiratory distress.  Abdominal:     General: There is no distension.     Palpations: Abdomen is soft.     Tenderness: There is abdominal tenderness in the left lower quadrant. There is no guarding or rebound.  Musculoskeletal:        General: Normal range of motion.     Cervical back: Normal range of motion.     Comments: No midline spinal tenderness palpation.  No crepitus step-off or deformity spine.  Mild left paralumbar muscular tenderness palpation.  No significant SI joint tenderness palpation.  Full ROM of the bilateral hips knees feet and ankles.  5/5 dorsi/plantarflexion, EHL and knee flexion/extension.  Negative clonus test.  Pedal pulses intact.  Capillary fill and sensation intact.  Feet:     Right foot:     Protective Sensation: 3 sites tested.  3 sites sensed.     Left foot:     Protective Sensation: 3 sites tested.  3 sites sensed.  Skin:    General: Skin is warm and dry.  Neurological:     Mental Status: She is alert.     GCS: GCS eye subscore is 4. GCS verbal subscore is 5. GCS motor subscore is 6.     Comments: Speech is clear and goal oriented, follows commands Major Cranial nerves without deficit, no facial droop Moves extremities without ataxia, coordination intact  Psychiatric:        Behavior: Behavior normal.     ED Results / Procedures / Treatments   Labs (all labs ordered are listed, but only abnormal results are displayed) Labs Reviewed  I-STAT CHEM 8, ED - Abnormal; Notable for the following  components:      Result Value   Calcium, Ion 1.12 (*)    All other components within normal limits  CBC WITH DIFFERENTIAL/PLATELET  COMPREHENSIVE METABOLIC PANEL  LIPASE, BLOOD  URINALYSIS, ROUTINE W REFLEX MICROSCOPIC    EKG None  Radiology CT L-SPINE NO CHARGE  Result Date: 09/21/2022 CLINICAL DATA:  Left lower quadrant abdominal pain radiating into the left low back. EXAM: CT Lumbar Spine without contrast TECHNIQUE: Technique: Multiplanar CT images of the lumbar spine were reconstructed from contemporary CT of the Abdomen and Pelvis. RADIATION DOSE REDUCTION: This exam was performed according to the departmental dose-optimization program which includes automated exposure control, adjustment of the mA and/or kV according to patient size and/or use of iterative reconstruction technique. CONTRAST:  No additional COMPARISON:  Abdominopelvic CT 02/07/2020 FINDINGS: Segmentation: There are 5 lumbar type vertebral bodies. S1 has transitional features. Alignment: Mild convex right scoliosis. The lateral alignment is normal. Vertebrae: No evidence of acute fracture or traumatic subluxation. Mild sacroiliac degenerative changes bilaterally. Paraspinal and other soft tissues: The paraspinal soft tissues appear unremarkable. Abdominopelvic CT findings dictated separately. Disc levels: No significant disc space findings at or above L3-4. At L4-5, there is mild disc bulging and facet hypertrophy asymmetric to the left. Mild left inferior foraminal narrowing without definite nerve root encroachment. At L5-S1, there is chronic disc space narrowing with endplate osteophytes asymmetric to the right, mild facet hypertrophy and mild foraminal narrowing bilaterally. IMPRESSION: 1. No acute findings or explanation for the patient's symptoms. 2. Mild spondylosis as described with mild foraminal narrowing bilaterally at L5-S1 and on the left at L4-5. 3. Abdominopelvic CT findings dictated separately. Electronically Signed    By: Richardean Sale M.D.   On: 09/21/2022 14:29   CT ABDOMEN PELVIS W CONTRAST  Result Date: 09/21/2022 CLINICAL DATA:  Left lower quadrant abdominal pain radiating into the low back. Diverticulitis, complications suspected. EXAM: CT ABDOMEN AND PELVIS WITH CONTRAST TECHNIQUE: Multidetector CT imaging of the abdomen and pelvis was performed using the standard protocol following bolus administration of intravenous contrast. RADIATION DOSE REDUCTION: This exam was performed according to the departmental dose-optimization program which includes automated exposure control, adjustment of the mA and/or kV according to patient size and/or use of iterative reconstruction technique. CONTRAST:  44m OMNIPAQUE IOHEXOL 350 MG/ML SOLN COMPARISON:  Abdominopelvic CT 02/07/2024 and 07/16/2023. FINDINGS: Lower chest: Clear lung bases. No significant pleural or pericardial effusion. Small hiatal hernia. Hepatobiliary: The liver is normal in density without suspicious focal abnormality. No evidence of gallstones, gallbladder wall thickening or biliary dilatation. Pancreas: Unremarkable. No pancreatic ductal dilatation or surrounding inflammatory changes. Spleen: Normal in size without focal abnormality. Adrenals/Urinary Tract: Both adrenal  glands appear normal. No evidence of urinary tract calculus, suspicious renal lesion or hydronephrosis. Stable mild cortical scarring in the anterior interpolar region of the right kidney. The bladder appears unremarkable for its degree of distention. Stomach/Bowel: No enteric contrast administered. The stomach appears unremarkable for its degree of distension. No evidence of bowel wall thickening, distention or surrounding inflammatory change. The appendix appears normal. Mild descending colonic diverticulosis without evidence of acute inflammation. Vascular/Lymphatic: There are no enlarged abdominal or pelvic lymph nodes. Mild aortoiliac atherosclerosis. Reproductive: Hysterectomy.  No  adnexal mass. Other: No evidence of abdominal wall mass or hernia. No ascites. Musculoskeletal: No acute or significant osseous findings. Mild lumbar spondylosis associated with a convex right scoliosis. IMPRESSION: 1. No acute findings or explanation for the patient's symptoms. 2. Mild descending colonic diverticulosis without evidence of acute inflammation. 3.  Aortic Atherosclerosis (ICD10-I70.0). Electronically Signed   By: Richardean Sale M.D.   On: 09/21/2022 14:24    Procedures Procedures    Medications Ordered in ED Medications  fentaNYL (SUBLIMAZE) injection 50 mcg (has no administration in time range)  iohexol (OMNIPAQUE) 350 MG/ML injection 75 mL (75 mLs Intravenous Contrast Given 09/21/22 1414)    ED Course/ Medical Decision Making/ A&P Clinical Course as of 09/21/22 1549  Sun Sep 21, 2022  1455 CBC with Differential CBC without leukocytosis to suggest infectious process.  No anemia or thrombocytopenia. [BM]  1455 Comprehensive metabolic panel CMP without emergent electrolyte derangement, AKI, LFT elevations or Gap. [BM]  1456 Lipase, blood Lipase within normal limits, doubt pancreatitis. [BM]  1547 CT L-SPINE NO CHARGE I personally reviewed patient's CT L-spine.  I do not appreciate any obvious acute displaced fractures or traumatic spondylolisthesis.  Please see radiology interpretation. [BM]  5462 CT ABDOMEN PELVIS W CONTRAST I have personally reviewed and interpreted patient CT abdomen pelvis.  I do not appreciate any obvious diverticulitis, cholecystitis, SBO.  Please see radiologist interpretation. [BM]    Clinical Course User Index [BM] Deliah Boston, PA-C                           Medical Decision Making 67 year old female presented for left low back pain onset yesterday after episode of diarrhea.  On exam she is well-appearing, uncomfortable but not in acute distress.  She is mildly tender in the left paralumbar musculature.  She is also tender to the left lower  quadrant.  Neuroexam is reassuring.  Differential includes but not limited to musculoskeletal low back pain, sciatica, kidney stone disease, pyelonephritis, diverticulitis, pancreatitis.  Will obtain abdominal pain labs.  Risk versus benefits of CT imaging were discussed with patient and patient elected to proceed.  Will order CT abdomen pelvis as well as CT lumbar spine.  No red flags at this time to suggest cauda equina or spinal dural abscess.  Amount and/or Complexity of Data Reviewed Labs: ordered. Decision-making details documented in ED Course. Radiology: ordered. Decision-making details documented in ED Course.  Risk Prescription drug management. Risk Details: Patient assessed she is resting comfortably in bed reports continued pain.  She has not received pain medication yet.  I have asked the nurse to provide this to her.  We are also awaiting a urinalysis to help rule out pyelonephritis as cause of left lower quadrant and left low back pain.  Care handoff given to Dr. Mammie Lorenzo at shift change.  Pending normal UA and improvement of symptoms anticipate discharge for musculoskeletal low back pain.  Note: Portions of this report may have been transcribed using voice recognition software. Every effort was made to ensure accuracy; however, inadvertent computerized transcription errors may still be present.         Final Clinical Impression(s) / ED Diagnoses Final diagnoses:  None    Rx / DC Orders ED Discharge Orders     None         Gari Crown 09/21/22 1550    Dorie Rank, MD 09/21/22 2020

## 2022-09-21 NOTE — ED Provider Triage Note (Signed)
Emergency Medicine Provider Triage Evaluation Note  [name] Molina , a 67 y.o. female  was evaluated in triage.    Patient reports she was in her normal state of health yesterday taking care of her granddaughter and they went to go get some smoothies.  Shortly after eating the smoothies patient developed diarrhea she reports several episodes of nonbloody diarrhea.  While she was sitting on the toilet she developed severe left lower back pain that does not radiate.  This pain has been constant she tried to lie down to help with the pain.  When she got up this morning pain worsened.  She denies history of chronic back pain.  Review of Systems  Positive: Abdominal pain, diarrhea, left low back pain Negative: Fever, chills, chest pain/shortness of breath, dysuria/hematuria, numbness, tingling, weakness, saddle paresthesias, incontinence, retention, IV drug use, night sweats, weight loss or any additional concerns.  Physical Exam  BP 126/88 (BP Location: Right Arm)   Pulse 84   Temp 98.2 F (36.8 C)   Resp 18   SpO2 99%  Gen:   Awake, no distress   Resp:  Normal effort  MSK:   Moves extremities without difficulty.  No midline spinal tenderness palpation.  No crepitus step-off or deformity of the spine.  No significant paraspinal muscular tenderness palpation.  No SI joint tenderness palpation. Other:  Abdomen is soft with tenderness to the left lower quadrant.  Medical Decision Making  Medically screening exam initiated at 9:15 AM.  Appropriate orders placed.  [name] Molina was informed that the remainder of the evaluation will be completed by another provider, this initial triage assessment does not replace that evaluation, and the importance of remaining in the ED until their evaluation is complete.  Risk versus benefits of CT imaging were discussed with patient.  Patient elects to proceed with CT imaging.  Note: Portions of this report may have been transcribed using voice recognition  software. Every effort was made to ensure accuracy; however, inadvertent computerized transcription errors may still be present.    Deliah Boston, [place] 09/21/22 [phone]

## 2022-09-22 ENCOUNTER — Telehealth: Payer: Self-pay | Admitting: *Deleted

## 2022-09-22 NOTE — Telephone Encounter (Signed)
Pharmacy called related to Rx: zanaflex capsules not being covered by insurance .Marland KitchenMarland KitchenEDCM clarified with EDP to change Rx to: tablets.

## 2022-09-23 ENCOUNTER — Ambulatory Visit (INDEPENDENT_AMBULATORY_CARE_PROVIDER_SITE_OTHER): Payer: BC Managed Care – PPO | Admitting: Family Medicine

## 2022-09-23 ENCOUNTER — Encounter: Payer: Self-pay | Admitting: Family Medicine

## 2022-09-23 VITALS — BP 100/70 | HR 74 | Temp 98.1°F | Ht 60.0 in | Wt 142.5 lb

## 2022-09-23 DIAGNOSIS — M545 Low back pain, unspecified: Secondary | ICD-10-CM

## 2022-09-23 DIAGNOSIS — G43009 Migraine without aura, not intractable, without status migrainosus: Secondary | ICD-10-CM

## 2022-09-23 DIAGNOSIS — E038 Other specified hypothyroidism: Secondary | ICD-10-CM | POA: Diagnosis not present

## 2022-09-23 DIAGNOSIS — Z86010 Personal history of colonic polyps: Secondary | ICD-10-CM

## 2022-09-23 MED ORDER — SYNTHROID 100 MCG PO TABS: 100.0000 ug | ORAL_TABLET | Freq: Every day | ORAL | 3 refills | Status: DC

## 2022-09-23 NOTE — Patient Instructions (Signed)
Welcome to [facility]! It was a pleasure meeting you today.  As discussed, Please schedule a 5 month follow up visit today.  Get second shingrix at pharmacy  PLEASE NOTE:  If you had any LAB tests please let us know if you have not heard back within a few days. You may see your results on MyChart before we have a chance to review them but we will give you a call once they are reviewed by [place]. If we ordered any REFERRALS today, please let us know if you have not heard from their office within the next week.  Let us know through MyChart if you are needing REFILLS, or have your pharmacy send [place] the request. You can also use MyChart to communicate with me or any office staff.  Please try these tips to maintain a healthy lifestyle:  Eat most of your calories during the day when you are active. Eliminate processed foods including packaged sweets (pies, cakes, cookies), reduce intake of potatoes, white bread, white pasta, and white rice. Look for whole grain options, oat flour or almond flour.  Each meal should contain half fruits/vegetables, one quarter protein, and one quarter carbs (no bigger than a computer mouse).  Cut down on sweet beverages. This includes juice, soda, and sweet tea. Also watch fruit intake, though this is a healthier sweet option, it still contains natural sugar! Limit to 3 servings daily.  Drink at least 1 glass of water with each meal and aim for at least 8 glasses per day  Exercise at least 150 minutes every week.
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
## 2022-09-28 ENCOUNTER — Encounter: Payer: Self-pay | Admitting: Certified Registered Nurse Anesthetist

## 2022-10-01 LAB — HM DEXA SCAN

## 2022-10-02 ENCOUNTER — Encounter: Payer: Self-pay | Admitting: Internal Medicine

## 2022-10-03 ENCOUNTER — Encounter: Payer: Self-pay | Admitting: Internal Medicine

## 2022-10-03 ENCOUNTER — Ambulatory Visit (AMBULATORY_SURGERY_CENTER): Payer: Medicare Other | Admitting: Internal Medicine

## 2022-10-03 VITALS — BP 107/58 | HR 69 | Temp 98.7°F | Resp 13 | Ht 61.0 in | Wt 139.0 lb

## 2022-10-03 DIAGNOSIS — Z09 Encounter for follow-up examination after completed treatment for conditions other than malignant neoplasm: Secondary | ICD-10-CM | POA: Diagnosis present

## 2022-10-03 DIAGNOSIS — Z86010 Personal history of colonic polyps: Secondary | ICD-10-CM | POA: Diagnosis not present

## 2022-10-03 MED ORDER — SODIUM CHLORIDE 0.9 % IV SOLN: 500.0000 mL | Freq: Once | INTRAVENOUS | Status: DC

## 2022-10-03 NOTE — Patient Instructions (Signed)
YOU HAD AN ENDOSCOPIC PROCEDURE TODAY AT [facility]:   Refer to the procedure report that was given to you for any specific questions about what was found during the examination.  If the procedure report does not answer your questions, please call your gastroenterologist to clarify.  If you requested that your care partner not be given the details of your procedure findings, then the procedure report has been included in a sealed envelope for you to review at your convenience later.  YOU SHOULD EXPECT: Some feelings of bloating in the abdomen. Passage of more gas than usual.  Walking can help get rid of the air that was put into your GI tract during the procedure and reduce the bloating. If you had a lower endoscopy (such as a colonoscopy or flexible sigmoidoscopy) you may notice spotting of blood in your stool or on the toilet paper. If you underwent a bowel prep for your procedure, you may not have a normal bowel movement for a few days.  Please Note:  You might notice some irritation and congestion in your nose or some drainage.  This is from the oxygen used during your procedure.  There is no need for concern and it should clear up in a day or so.  SYMPTOMS TO REPORT IMMEDIATELY:  Following lower endoscopy (colonoscopy or flexible sigmoidoscopy):  Excessive amounts of blood in the stool  Significant tenderness or worsening of abdominal pains  Swelling of the abdomen that is new, acute  Fever of 100F or higher  Following upper endoscopy (EGD)  Vomiting of blood or coffee ground material  New chest pain or pain under the shoulder blades  Painful or persistently difficult swallowing  New shortness of breath  Fever of 100F or higher  Black, tarry-looking stools  For urgent or emergent issues, a gastroenterologist can be reached at any hour by calling [phone]. Do not use MyChart messaging for urgent concerns.    DIET:  We do recommend a small meal at first, but  then you may proceed to your regular diet.  Drink plenty of fluids but you should avoid alcoholic beverages for 24 hours.  ACTIVITY:  You should plan to take it easy for the rest of today and you should NOT DRIVE or use heavy machinery until tomorrow (because of the sedation medicines used during the test).    FOLLOW UP: Our staff will call the number listed on your records the next business day following your procedure.  We will call around 7:15- 8:00 am to check on you and address any questions or concerns that you may have regarding the information given to you following your procedure. If we do not reach you, we will leave a message.     SIGNATURES/CONFIDENTIALITY: You and/or your care partner have signed paperwork which will be entered into your electronic medical record.  These signatures attest to the fact that that the information above on your After Visit Summary has been reviewed and is understood.  Full responsibility of the confidentiality of this discharge information lies with you and/or your care-partner.

## 2022-10-03 NOTE — Progress Notes (Signed)
Pt's states no medical or surgical changes since previsit or office visit. 

## 2022-10-03 NOTE — Progress Notes (Signed)
Report given to PACU, vss 

## 2022-10-03 NOTE — Op Note (Signed)
[place] Patient Name: [name] Molina Procedure Date: 10/03/2022 7:45 AM MRN: [id number] Endoscopist: Jerene Bears , MD, [id number] Age: 67 Referring MD:  Date of Birth: 1955-12-16 Gender: Female Account #: [id number] Procedure:                Colonoscopy Indications:              High risk colon cancer surveillance: Personal                            history of non-advanced adenoma, Last colonoscopy:                            October 2018 Medicines:                Monitored Anesthesia Care Procedure:                Pre-Anesthesia Assessment:                           - Prior to the procedure, a History and Physical                            was performed, and patient medications and                            allergies were reviewed. The patient's tolerance of                            previous anesthesia was also reviewed. The risks                            and benefits of the procedure and the sedation                            options and risks were discussed with the patient.                            All questions were answered, and informed consent                            was obtained. Prior Anticoagulants: The patient has                            taken no anticoagulant or antiplatelet agents. ASA                            Grade Assessment: II - A patient with mild systemic                            disease. After reviewing the risks and benefits,                            the patient was deemed in satisfactory condition to  undergo the procedure.                           After obtaining informed consent, the colonoscope                            was passed under direct vision. Throughout the                            procedure, the patient's blood pressure, pulse, and                            oxygen saturations were monitored continuously. The                            Olympus Scope [id number] was introduced through the                             anus and advanced to the cecum, identified by                            appendiceal orifice and ileocecal valve. The                            colonoscopy was performed without difficulty. The                            patient tolerated the procedure well. The quality                            of the bowel preparation was excellent. The                            ileocecal valve, appendiceal orifice, and rectum                            were photographed. Scope In: 8:09:06 AM Scope Out: 8:19:17 AM Scope Withdrawal Time: 0 hours 7 minutes 23 seconds  Total Procedure Duration: 0 hours 10 minutes 11 seconds  Findings:                 The digital rectal exam was normal.                           The entire examined colon appeared normal.                           Internal hemorrhoids were found during                            retroflexion. The hemorrhoids were small. Complications:            No immediate complications. Estimated Blood Loss:     Estimated blood loss: none. Impression:               - The entire examined colon is normal.                           -  Internal hemorrhoids.                           - No specimens collected. Recommendation:           - Patient has a contact number available for                            emergencies. The signs and symptoms of potential                            delayed complications were discussed with the                            patient. Return to normal activities tomorrow.                            Written discharge instructions were provided to the                            patient.                           - Resume previous diet.                           - Continue present medications.                           - Repeat colonoscopy in 10 years for surveillance. Jerene Bears, MD 10/03/2022 8:21:30 AM This report has been signed electronically.

## 2022-10-03 NOTE — Progress Notes (Signed)
GASTROENTEROLOGY PROCEDURE H&P NOTE   Primary Care Physician: Tawnya Crook, MD    Reason for Procedure:  History of adenomatous colon polyp  Plan:    Colonoscopy  Patient is appropriate for endoscopic procedure(s) in the ambulatory ([facility]) setting.  The nature of the procedure, as well as the risks, benefits, and alternatives were carefully and thoroughly reviewed with the patient. Ample time for discussion and questions allowed. The patient understood, was satisfied, and agreed to proceed.     HPI: [name] Molina is a 67 y.o. female who presents for surveillance colonoscopy`.  Medical history as below.  Tolerated the prep.  No recent chest pain or shortness of breath.  No abdominal pain today.  Past Medical History:  Diagnosis Date   Allergy    Anemia    Anxiety    Cataract    RIGHT EYE,REMOVED   Depression    Headache(784.0)    migraine   Hemorrhoids 11/15/2007   Hiatal hernia    IBS (irritable bowel syndrome)    Internal hemorrhoids    Osteoporosis    Thyroid disease    Tubular adenoma of colon     Past Surgical History:  Procedure Laterality Date   ABDOMINAL HYSTERECTOMY     CATRACTS Right    COLONOSCOPY  11/15/2007   Dr. Sharlett Iles    EYE SURGERY  22/23   INGUINAL HERNIA REPAIR     LAPAROSCOPY N/A 12/14/2012   Procedure: LAPAROSCOPY OPERATIVE;  Surgeon: Maisie Fus, MD;  Location: [facility];  Service: Gynecology;  Laterality: N/A;  with Bilateral Ovarian Cystectomies   ovaries removed     RIGHT EYE SURGERY     RETINA WITH HOLE   TONSILLECTOMY      Prior to Admission medications   Medication Sig Start Date End Date Taking? Authorizing Provider  SYNTHROID 100 MCG tablet Take 1 tablet (100 mcg total) by mouth daily. 09/23/22  Yes Tawnya Crook, MD  ELDERBERRY PO Take by mouth as needed.    [provider]  estradiol (VIVELLE-DOT) 0.05 MG/24HR Place 1 patch onto the skin once a week.    [provider]  naproxen (NAPROSYN) 375 MG  tablet Take 1 tablet (375 mg total) by mouth 2 (two) times daily. 09/21/22   Phyllis Ginger, MD  SUMAtriptan (IMITREX) 100 MG tablet Take 1 tablet by mouth as needed. 05/15/17   [provider]    Current Outpatient Medications  Medication Sig Dispense Refill   SYNTHROID 100 MCG tablet Take 1 tablet (100 mcg total) by mouth daily. 90 tablet 3   ELDERBERRY PO Take by mouth as needed.     estradiol (VIVELLE-DOT) 0.05 MG/24HR Place 1 patch onto the skin once a week.     naproxen (NAPROSYN) 375 MG tablet Take 1 tablet (375 mg total) by mouth 2 (two) times daily. 20 tablet 0   SUMAtriptan (IMITREX) 100 MG tablet Take 1 tablet by mouth as needed.     Current Facility-Administered Medications  Medication Dose Route Frequency Provider Last Rate Last Admin   0.9 %  sodium chloride infusion  500 mL Intravenous Continuous Desaree Downen, Lajuan Lines, MD       0.9 %  sodium chloride infusion  500 mL Intravenous Once Jamai Dolce, Lajuan Lines, MD        Allergies as of 10/03/2022 - Review Complete 10/03/2022  Allergen Reaction Noted   Morphine and related Nausea And Vomiting 01/12/2013   Other  01/12/2013   Benadryl [diphenhydramine hcl] Other (  See Comments) 10/19/2012   Codeine Nausea Only 10/19/2012    Family History  Problem Relation Age of Onset   Cancer Mother    Heart disease Father    Heart attack Father    Hypertension Sister    Cancer Brother    Prostate cancer Brother    Drug abuse Daughter    Heart attack Maternal Grandfather    Colon cancer Neg Hx    Esophageal cancer Neg Hx    Rectal cancer Neg Hx    Stomach cancer Neg Hx    Colon polyps Neg Hx    Crohn's disease Neg Hx    Ulcerative colitis Neg Hx     Social History   Socioeconomic History   Marital status: Married    Spouse name: Not on file   Number of children: 2   Years of education: Not on file   Highest education level: Not on file  Occupational History   Not on file  Tobacco Use   Smoking status: Never    Passive exposure:  Never   Smokeless tobacco: Never  Vaping Use   Vaping Use: Never used  Substance and Sexual Activity   Alcohol use: Yes    Alcohol/week: 3.0 standard drinks of alcohol    Types: 3 Glasses of wine per week    Comment: 2-3 glasses of wine through out the week   Drug use: No   Sexual activity: Not Currently    Birth control/protection: Surgical    Comment: Hysterectomy  Other Topics Concern   Not on file  Social History Narrative   Not on file   Social Determinants of Health   Financial Resource Strain: Not on file  Food Insecurity: Not on file  Transportation Needs: Not on file  Physical Activity: Not on file  Stress: Not on file  Social Connections: Not on file  Intimate Partner Violence: Not on file    Physical Exam: Vital signs in last 24 hours: '@BP'$  116/70   Pulse 73   Temp 98.7 F (37.1 C) (Skin)   Resp 19   Ht '5\' 1"'$  (1.549 m)   Wt 139 lb (63 kg)   SpO2 100%   BMI 26.26 kg/m  GEN: NAD EYE: Sclerae anicteric ENT: MMM CV: Non-tachycardic Pulm: CTA b/l GI: Soft, NT/ND NEURO:  Alert & Oriented x 3   Zenovia Jarred, MD [facility]  10/03/2022 8:06 AM

## 2022-10-06 ENCOUNTER — Telehealth: Payer: Self-pay | Admitting: *Deleted

## 2022-10-06 NOTE — Telephone Encounter (Signed)
Attempted to call patient for their post-procedure follow-up call. No answer. Left voicemail.   

## 2023-02-12 ENCOUNTER — Encounter: Payer: Self-pay | Admitting: Family Medicine

## 2023-02-12 ENCOUNTER — Ambulatory Visit (INDEPENDENT_AMBULATORY_CARE_PROVIDER_SITE_OTHER): Payer: Medicare Other | Admitting: Family Medicine

## 2023-02-12 VITALS — BP 116/70 | HR 67 | Temp 97.6°F | Resp 16 | Ht 60.0 in | Wt 141.4 lb

## 2023-02-12 DIAGNOSIS — E038 Other specified hypothyroidism: Secondary | ICD-10-CM | POA: Diagnosis not present

## 2023-02-12 DIAGNOSIS — Z1159 Encounter for screening for other viral diseases: Secondary | ICD-10-CM

## 2023-02-12 DIAGNOSIS — G43009 Migraine without aura, not intractable, without status migrainosus: Secondary | ICD-10-CM | POA: Diagnosis not present

## 2023-02-12 LAB — COMPREHENSIVE METABOLIC PANEL
ALT: 14 U/L (ref 0–35)
AST: 17 U/L (ref 0–37)
Albumin: 4.2 g/dL (ref 3.5–5.2)
Alkaline Phosphatase: 62 U/L (ref 39–117)
BUN: 15 mg/dL (ref 6–23)
CO2: 29 mEq/L (ref 19–32)
Calcium: 9.6 mg/dL (ref 8.4–10.5)
Chloride: 103 mEq/L (ref 96–112)
Creatinine, Ser: 0.68 mg/dL (ref 0.40–1.20)
GFR: 90.55 mL/min (ref >60.00)
Glucose, Bld: 79 mg/dL (ref 70–99)
Potassium: 4 mEq/L (ref 3.5–5.1)
Sodium: 139 mEq/L (ref 135–145)
Total Bilirubin: 0.5 mg/dL (ref 0.2–1.2)
Total Protein: 7.2 g/dL (ref 6.0–8.3)

## 2023-02-12 LAB — T4, FREE: Free T4: 1.03 ng/dL (ref 0.60–1.60)

## 2023-02-12 LAB — TSH: TSH: 0.35 u[IU]/mL (ref 0.35–5.50)

## 2023-02-12 LAB — T3, FREE: T3, Free: 2.4 pg/mL (ref 2.3–4.2)

## 2023-02-12 NOTE — Patient Instructions (Signed)
It was very nice to see you today!  Enjoy the summer   PLEASE NOTE:  If you had any lab tests please let us know if you have not heard back within a few days. You may see your results on MyChart before we have a chance to review them but we will give you a call once they are reviewed by [place]. If we ordered any referrals today, please let us know if you have not heard from their office within the next week.   Please try these tips to maintain a healthy lifestyle:  Eat most of your calories during the day when you are active. Eliminate processed foods including packaged sweets (pies, cakes, cookies), reduce intake of potatoes, white bread, white pasta, and white rice. Look for whole grain options, oat flour or almond flour.  Each meal should contain half fruits/vegetables, one quarter protein, and one quarter carbs (no bigger than a computer mouse).  Cut down on sweet beverages. This includes juice, soda, and sweet tea. Also watch fruit intake, though this is a healthier sweet option, it still contains natural sugar! Limit to 3 servings daily.  Drink at least 1 glass of water with each meal and aim for at least 8 glasses per day  Exercise at least 150 minutes every week.

## 2023-02-12 NOTE — Progress Notes (Signed)
Subjective:     Patient ID: [name] Molina, female    DOB: [birth year], 67 y.o.   MRN: [id number]  Chief Complaint  Patient presents with   Medical Management of Chronic Issues    5 month follow-up on thyroid Not fasting, had a little bit of cereal Burning pain in left groin that started a couple of months ago, worse at times     HPI  Hypothyroidism-on synthroid .  Taking correctly.  No palpation,tremors. Migraine no aura-1-2x/year(s).  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 On vivelle dot 0.05mg .  doing well.  Symptoms controlled.  Also has osteopenia.  DXA around Dec.   Walking.  Right foot pain-seeing orthopedic.  History of stress fracture  Health Maintenance Due  Topic Date Due   Hepatitis C Screening  Never done   DEXA SCAN  Never done   Medicare Annual Wellness (AWV)  12/10/2022    Past Medical History:  Diagnosis Date   Allergy    Anemia    Anxiety    Cataract    RIGHT EYE,REMOVED   Depression    Headache(784.0)    migraine   Hemorrhoids 11/15/2007   Hiatal hernia    IBS (irritable bowel syndrome)    Internal hemorrhoids    Osteoporosis    Thyroid disease    Tubular adenoma of colon     Past Surgical History:  Procedure Laterality Date   ABDOMINAL HYSTERECTOMY     CATRACTS Right    COLONOSCOPY  11/15/2007   Dr. Jarold Motto    EYE SURGERY  22/23   INGUINAL HERNIA REPAIR     LAPAROSCOPY N/A 12/14/2012   Procedure: LAPAROSCOPY OPERATIVE;  Surgeon: Freddy Finner, MD;  Location: [facility];  Service: Gynecology;  Laterality: N/A;  with Bilateral Ovarian Cystectomies   ovaries removed     RIGHT EYE SURGERY     RETINA WITH HOLE   TONSILLECTOMY       Current Outpatient Medications:    ELDERBERRY PO, Take by mouth as needed., Disp: , Rfl:    estradiol (VIVELLE-DOT) 0.05 MG/24HR, Place 1 patch onto the skin once a week., Disp: , Rfl:    SUMAtriptan (IMITREX) 100 MG tablet, Take 1 tablet by mouth as needed., Disp: , Rfl:    SYNTHROID 100 MCG tablet, Take  1 tablet (100 mcg total) by mouth daily., Disp: 90 tablet, Rfl: 3  Allergies  Allergen Reactions   Morphine And Codeine Nausea And Vomiting   Other     Other Reaction(s): Other  mophine   BANDAGES - HAVE CAUSED SEVERE REDNESS   Benadryl [Diphenhydramine Hcl] Other (See Comments)    Anxious & jittery   Codeine Nausea Only   Diphenhydramine Hcl     Other Reaction(s): Other (See Comments)  Anxious & jittery   ROS neg/noncontributory except as noted HPI/below  Raising grandchildren Got into poison ivy right forearm.   Also, cough for 1 week(s). Occasional mid epi pain at night-time.Tums help some. 1-2x/week(s) for many months.  Stressed. Has changed diet.       Objective:     BP 116/70   Pulse 67   Temp 97.6 F (36.4 C) (Temporal)   Resp 16   Ht 5' (1.524 m)   Wt 141 lb 6 oz (64.1 kg)   SpO2 98%   BMI 27.61 kg/m  Wt Readings from Last 3 Encounters:  02/12/23 141 lb 6 oz (64.1 kg)  10/03/22 139 lb (63 kg)  09/23/22 142 lb 8 oz (64.6 kg)  Physical Exam   Gen: WDWN NAD HEENT: NCAT, conjunctiva not injected, sclera nonicteric NECK:  supple, no thyromegaly, no nodes, no carotid bruits CARDIAC: RRR, S1S2+, no murmur. DP 2+B LUNGS: CTAB. No wheezes ABDOMEN:  BS+, soft, NTND, No HSM, no masses EXT:  no edema MSK: no gross abnormalities.  NEURO: A&O x3.  CN II-XII intact.  PSYCH: normal mood. Good eye contact     Assessment & Plan:  Other specified hypothyroidism Assessment & Plan: Chronic.  Controlled.  Continue synthroid 167mcg/day  Orders: -     TSH -     Comprehensive metabolic panel -     T3, free -     T4, free  Migraine without aura and without status migrainosus, not intractable Assessment & Plan: Chronic.  Rarely gets.  Imitrex 100 mg works well.  continue   Screening for viral disease -     Hepatitis C antibody    Return in about 1 year (around 02/12/2024) for annual physical.  Angelena Sole, MD

## 2023-02-12 NOTE — Assessment & Plan Note (Signed)
Chronic.  Controlled.  Continue synthroid 167mcg/day

## 2023-02-12 NOTE — Assessment & Plan Note (Signed)
Chronic.  Rarely gets.  Imitrex 100 mg works well.  continue

## 2023-02-13 LAB — HEPATITIS C ANTIBODY: Hepatitis C Ab: NONREACTIVE

## 2023-02-14 NOTE — Progress Notes (Signed)
Same dose of thyroid

## 2023-02-16 NOTE — Telephone Encounter (Signed)
Patient is aware that insurance will not cover due to BMI <30. She is considering enrolling into an online program to get medication. Wants PCP thoughts on if medication is safe for her to take. Please advise (Pt prefers response thru MyChart if possible)

## 2023-03-10 ENCOUNTER — Ambulatory Visit (INDEPENDENT_AMBULATORY_CARE_PROVIDER_SITE_OTHER): Payer: Medicare Other

## 2023-03-10 VITALS — Wt 141.0 lb

## 2023-03-10 DIAGNOSIS — Z0000 Encounter for general adult medical examination without abnormal findings: Secondary | ICD-10-CM

## 2023-03-10 NOTE — Progress Notes (Addendum)
Subjective:   [name] Molina is a 67 y.o. female who presents for an Initial Medicare Annual Wellness Visit.  Visit Complete: Virtual  I connected with  [name] Molina on 03/10/23 by a audio enabled telemedicine application and verified that I am speaking with the correct person using two identifiers.  Patient Location: Home  Provider Location: Office/Clinic  I discussed the limitations of evaluation and management by telemedicine. The patient expressed understanding and agreed to proceed.  Patient Medicare AWV questionnaire was completed by the patient on 03/06/23; I have confirmed that all information answered by patient is correct and no changes since this date.  Review of Systems     Cardiac Risk Factors include: advanced age (>87men, >58 women)     Objective:    Today's Vitals   03/10/23 0801  Weight: 141 lb (64 kg)   Body mass index is 27.54 kg/m.     03/10/2023    8:04 AM 12/13/2012    9:00 PM 12/13/2012    8:09 PM 12/12/2012   10:00 AM  Advanced Directives  Does Patient Have a Medical Advance Directive? No Patient does not have advance directive Patient does not have advance directive;Patient would like information Patient does not have advance directive  Would patient like information on creating a medical advance directive? No - Patient declined  Advance directive packet given   Pre-existing out of facility DNR order (yellow form or pink MOST form)  No No No    Current Medications (verified) Outpatient Encounter Medications as of 03/10/2023  Medication Sig   ELDERBERRY PO Take by mouth as needed.   estradiol (VIVELLE-DOT) 0.05 MG/24HR Place 1 patch onto the skin once a week.   SUMAtriptan (IMITREX) 100 MG tablet Take 1 tablet by mouth as needed.   SYNTHROID 100 MCG tablet Take 1 tablet (100 mcg total) by mouth daily.   No facility-administered encounter medications on file as of 03/10/2023.    Allergies (verified) Morphine and codeine, Other, Benadryl  [diphenhydramine hcl], Codeine, and Diphenhydramine hcl   History: Past Medical History:  Diagnosis Date   Allergy    Anemia    Anxiety    Cataract    RIGHT EYE,REMOVED   Depression    Headache(784.0)    migraine   Hemorrhoids 11/15/2007   Hiatal hernia    IBS (irritable bowel syndrome)    Internal hemorrhoids    Osteoporosis    Thyroid disease    Tubular adenoma of colon    Past Surgical History:  Procedure Laterality Date   ABDOMINAL HYSTERECTOMY     CATRACTS Right    COLONOSCOPY  11/15/2007   Dr. Jarold Motto    EYE SURGERY  22/23   INGUINAL HERNIA REPAIR     LAPAROSCOPY N/A 12/14/2012   Procedure: LAPAROSCOPY OPERATIVE;  Surgeon: Freddy Finner, MD;  Location: [facility];  Service: Gynecology;  Laterality: N/A;  with Bilateral Ovarian Cystectomies   ovaries removed     RIGHT EYE SURGERY     RETINA WITH HOLE   TONSILLECTOMY     Family History  Problem Relation Age of Onset   Cancer Mother    Heart disease Father    Heart attack Father    Hypertension Sister    Cancer Brother    Prostate cancer Brother    Drug abuse Daughter    Heart attack Maternal Grandfather    Colon cancer Neg Hx    Esophageal cancer Neg Hx    Rectal cancer Neg Hx  Stomach cancer Neg Hx    Colon polyps Neg Hx    Crohn's disease Neg Hx    Ulcerative colitis Neg Hx    Social History   Socioeconomic History   Marital status: Married    Spouse name: Not on file   Number of children: 2   Years of education: Not on file   Highest education level: Some college, no degree  Occupational History   Not on file  Tobacco Use   Smoking status: Never    Passive exposure: Never   Smokeless tobacco: Never  Vaping Use   Vaping Use: Never used  Substance and Sexual Activity   Alcohol use: Yes    Alcohol/week: 3.0 standard drinks of alcohol    Types: 3 Glasses of wine per week    Comment: 2-3 glasses of wine through out the week   Drug use: No   Sexual activity: Not Currently    Birth  control/protection: Surgical    Comment: Hysterectomy  Other Topics Concern   Not on file  Social History Narrative   Not on file   Social Determinants of Health   Financial Resource Strain: Medium Risk (03/06/2023)   Overall Financial Resource Strain (CARDIA)    Difficulty of Paying Living Expenses: Somewhat hard  Food Insecurity: No Food Insecurity (03/06/2023)   Hunger Vital Sign    Worried About Running Out of Food in the Last Year: Never true    Ran Out of Food in the Last Year: Never true  Recent Concern: Food Insecurity - Food Insecurity Present (02/11/2023)   Hunger Vital Sign    Worried About Running Out of Food in the Last Year: Sometimes true    Ran Out of Food in the Last Year: Often true  Transportation Needs: No Transportation Needs (03/06/2023)   PRAPARE - Administrator, Civil Service (Medical): No    Lack of Transportation (Non-Medical): No  Physical Activity: Sufficiently Active (03/06/2023)   Exercise Vital Sign    Days of Exercise per Week: 5 days    Minutes of Exercise per Session: 50 min  Recent Concern: Physical Activity - Insufficiently Active (02/11/2023)   Exercise Vital Sign    Days of Exercise per Week: 2 days    Minutes of Exercise per Session: 20 min  Stress: No Stress Concern Present (03/06/2023)   [employer] of Occupational Health - Occupational Stress Questionnaire    Feeling of Stress : Only a little  Social Connections: Unknown (03/06/2023)   Social Connection and Isolation Panel [NHANES]    Frequency of Communication with Friends and Family: More than three times a week    Frequency of Social Gatherings with Friends and Family: Twice a week    Attends Religious Services: Patient declined    Database administrator or Organizations: No    Attends Engineer, structural: Patient declined    Marital Status: Married    Tobacco Counseling Counseling given: Not Answered   Clinical Intake:  Pre-visit preparation  completed: Yes  Pain : No/denies pain     BMI - recorded: 27.54 Nutritional Status: BMI 25 -29 Overweight Diabetes: No  How often do you need to have someone help you when you read instructions, pamphlets, or other written materials from your doctor or pharmacy?: 1 - Never  Interpreter Needed?: No  Information entered by :: Lanier Ensign, LPN   Activities of Daily Living    03/06/2023    8:55 AM  In your present state  of health, do you have any difficulty performing the following activities:  Hearing? 0  Vision? 0  Difficulty concentrating or making decisions? 0  Walking or climbing stairs? 0  Dressing or bathing? 0  Doing errands, shopping? 0  Preparing Food and eating ? N  Using the Toilet? N  In the past six months, have you accidently leaked urine? N  Do you have problems with loss of bowel control? N  Managing your Medications? N  Managing your Finances? N  Housekeeping or managing your Housekeeping? N    Patient Care Team: Jeani Sow, MD as PCP - General (Family Medicine)  Indicate any recent Medical Services you may have received from other than [facility] providers in the past year (date may be approximate).     Assessment:   This is a routine wellness examination for [place].  Hearing/Vision screen Hearing Screening - Comments:: Pt denies any hearing issues  Vision Screening - Comments:: Pt follows up with dr Tiburcio Pea for annul eye exams   Dietary issues and exercise activities discussed:     Goals Addressed             This Visit's Progress    Patient Stated       Stay on the exercise course        Depression Screen    03/10/2023    8:03 AM 09/23/2022    9:38 AM  PHQ 2/9 Scores  PHQ - 2 Score 0 0    Fall Risk    03/06/2023    8:55 AM 02/12/2023    7:59 AM 09/23/2022    9:38 AM  Fall Risk   Falls in the past year? 0 0 0  Number falls in past yr:  0 0  Injury with Fall? 0 0 0  Risk for fall due to : Impaired vision No Fall Risks No  Fall Risks  Follow up Falls prevention discussed Falls evaluation completed Falls evaluation completed    MEDICARE RISK AT HOME:   TIMED UP AND GO:  Was the test performed? No    Cognitive Function:        03/10/2023    8:05 AM  6CIT Screen  What Year? 0 points  What month? 0 points  What time? 0 points  Count back from 20 0 points  Months in reverse 0 points  Repeat phrase 0 points  Total Score 0 points    Immunizations Immunization History  Administered Date(s) Administered   Tdap 08/14/2016   Zoster Recombinat (Shingrix) 01/03/2020   Zoster, Live 08/14/2016   Zoster, Unspecified 08/14/2016     TDAP status: Up to date  Flu Vaccine status: Due, Education has been provided regarding the importance of this vaccine. Advised may receive this vaccine at local pharmacy or Health Dept. Aware to provide a copy of the vaccination record if obtained from local pharmacy or Health Dept. Verbalized acceptance and understanding.  Pneumococcal vaccine status: Due, Education has been provided regarding the importance of this vaccine. Advised may receive this vaccine at local pharmacy or Health Dept. Aware to provide a copy of the vaccination record if obtained from local pharmacy or Health Dept. Verbalized acceptance and understanding.  Covid-19 vaccine status: Declined, Education has been provided regarding the importance of this vaccine but patient still declined. Advised may receive this vaccine at local pharmacy or Health Dept.or vaccine clinic. Aware to provide a copy of the vaccination record if obtained from local pharmacy or Health Dept. Verbalized acceptance and understanding.  Qualifies for Shingles Vaccine? Yes   Zostavax completed Yes     Screening Tests Health Maintenance  Topic Date Due   DEXA SCAN  Never done   Zoster Vaccines- Shingrix (2 of 2) 04/26/2023 (Originally 02/28/2020)   COVID-19 Vaccine (1) 04/28/2023 (Originally 05/14/1961)   Pneumonia Vaccine 50+  Years old (1 of 1 - PCV) 09/24/2023 (Originally 05/14/2021)   INFLUENZA VACCINE  04/16/2023   Medicare Annual Wellness (AWV)  03/09/2024   MAMMOGRAM  09/11/2024   DTaP/Tdap/Td (2 - Td or Tdap) 08/14/2026   Colonoscopy  10/04/2027   Hepatitis C Screening  Completed   HPV VACCINES  Aged Out    Health Maintenance  Health Maintenance Due  Topic Date Due   DEXA SCAN  Never done    Colorectal cancer screening: Type of screening: Colonoscopy. Completed 10/03/22. Repeat every 5 years  Mammogram status: Completed 09/11/22. Repeat every year  Dexa scan completed [facility] 2024  Additional Screening:  Hepatitis C Screening:  Completed 02/12/23  Vision Screening: Recommended annual ophthalmology exams for early detection of glaucoma and other disorders of the eye. Is the patient up to date with their annual eye exam?  Yes  Who is the provider or what is the name of the office in which the patient attends annual eye exams? Dr Tiburcio Pea  If pt is not established with a provider, would they like to be referred to a provider to establish care? No .   Dental Screening: Recommended annual dental exams for proper oral hygiene   Community Resource Referral / Chronic Care Management: CRR required this visit?  No   CCM required this visit?  No     Plan:     I have personally reviewed and noted the following in the patient's chart:   Medical and social history Use of alcohol, tobacco or illicit drugs  Current medications and supplements including opioid prescriptions. Patient is not currently taking opioid prescriptions. Functional ability and status Nutritional status Physical activity Advanced directives List of other physicians Hospitalizations, surgeries, and ER visits in previous 12 months Vitals Screenings to include cognitive, depression, and falls Referrals and appointments  In addition, I have reviewed and discussed with patient certain preventive protocols, quality  metrics, and best practice recommendations. A written personalized care plan for preventive services as well as general preventive health recommendations were provided to patient.     Marzella Schlein, LPN   [id number]   After Visit Summary: (MyChart) Due to this being a telephonic visit, the after visit summary with patients personalized plan was offered to patient via MyChart   Nurse Notes: none

## 2023-03-10 NOTE — Patient Instructions (Signed)
[name] Molina , Thank you for taking time to come for your Medicare Wellness Visit. I appreciate your ongoing commitment to your health goals. Please review the following plan we discussed and let me know if I can assist you in the future.   These are the goals we discussed:  Goals   None     This is a list of the screening recommended for you and due dates:  Health Maintenance  Topic Date Due   DEXA scan (bone density measurement)  Never done   Zoster (Shingles) Vaccine (2 of 2) 04/26/2023*   COVID-19 Vaccine (1) 04/28/2023*   Pneumonia Vaccine (1 of 1 - PCV) 09/24/2023*   Flu Shot  04/16/2023   Medicare Annual Wellness Visit  03/09/2024   Mammogram  09/11/2024   DTaP/Tdap/Td vaccine (2 - Td or Tdap) 08/14/2026   Colon Cancer Screening  10/04/2027   Hepatitis C Screening  Completed   HPV Vaccine  Aged Out  *Topic was postponed. The date shown is not the original due date.    Advanced directives: Advance directive discussed with you today. Even though you declined this today please call our office should you change your mind and we can give you the proper paperwork for you to fill out.  Conditions/risks identified: continue with exercise course   Next appointment: Follow up in one year for your annual wellness visit    Preventive Care 65 Years and Older, Female Preventive care refers to lifestyle choices and visits with your health care provider that can promote health and wellness. What does preventive care include? A yearly physical exam. This is also called an annual well check. Dental exams once or twice a year. Routine eye exams. Ask your health care provider how often you should have your eyes checked. Personal lifestyle choices, including: Daily care of your teeth and gums. Regular physical activity. Eating a healthy diet. Avoiding tobacco and drug use. Limiting alcohol use. Practicing safe sex. Taking low-dose aspirin every day. Taking vitamin and mineral supplements  as recommended by your health care provider. What happens during an annual well check? The services and screenings done by your health care provider during your annual well check will depend on your age, overall health, lifestyle risk factors, and family history of disease. Counseling  Your health care provider may ask you questions about your: Alcohol use. Tobacco use. Drug use. Emotional well-being. Home and relationship well-being. Sexual activity. Eating habits. History of falls. Memory and ability to understand (cognition). Work and work Astronomer. Reproductive health. Screening  You may have the following tests or measurements: Height, weight, and BMI. Blood pressure. Lipid and cholesterol levels. These may be checked every 5 years, or more frequently if you are over 70 years old. Skin check. Lung cancer screening. You may have this screening every year starting at age 58 if you have a 30-pack-year history of smoking and currently smoke or have quit within the past 15 years. Fecal occult blood test (FOBT) of the stool. You may have this test every year starting at age 23. Flexible sigmoidoscopy or colonoscopy. You may have a sigmoidoscopy every 5 years or a colonoscopy every 10 years starting at age 64. Hepatitis C blood test. Hepatitis B blood test. Sexually transmitted disease (STD) testing. Diabetes screening. This is done by checking your blood sugar (glucose) after you have not eaten for a while (fasting). You may have this done every 1-3 years. Bone density scan. This is done to screen for osteoporosis. You may  have this done starting at age 39. Mammogram. This may be done every 1-2 years. Talk to your health care provider about how often you should have regular mammograms. Talk with your health care provider about your test results, treatment options, and if necessary, the need for more tests. Vaccines  Your health care provider may recommend certain vaccines, such  as: Influenza vaccine. This is recommended every year. Tetanus, diphtheria, and acellular pertussis (Tdap, Td) vaccine. You may need a Td booster every 10 years. Zoster vaccine. You may need this after age 80. Pneumococcal 13-valent conjugate (PCV13) vaccine. One dose is recommended after age 29. Pneumococcal polysaccharide (PPSV23) vaccine. One dose is recommended after age 72. Talk to your health care provider about which screenings and vaccines you need and how often you need them. This information is not intended to replace advice given to you by your health care provider. Make sure you discuss any questions you have with your health care provider. Document Released: 09/28/2015 Document Revised: 05/21/2016 Document Reviewed: 07/03/2015 Elsevier Interactive Patient Education  2017 [employer] Prevention in the Home Falls can cause injuries. They can happen to people of all ages. There are many things you can do to make your home safe and to help prevent falls. What can I do on the outside of my home? Regularly fix the edges of walkways and driveways and fix any cracks. Remove anything that might make you trip as you walk through a door, such as a raised step or threshold. Trim any bushes or trees on the path to your home. Use bright outdoor lighting. Clear any walking paths of anything that might make someone trip, such as rocks or tools. Regularly check to see if handrails are loose or broken. Make sure that both sides of any steps have handrails. Any raised decks and porches should have guardrails on the edges. Have any leaves, snow, or ice cleared regularly. Use sand or salt on walking paths during winter. Clean up any spills in your garage right away. This includes oil or grease spills. What can I do in the bathroom? Use night lights. Install grab bars by the toilet and in the tub and shower. Do not use towel bars as grab bars. Use non-skid mats or decals in the tub or  shower. If you need to sit down in the shower, use a plastic, non-slip stool. Keep the floor dry. Clean up any water that spills on the floor as soon as it happens. Remove soap buildup in the tub or shower regularly. Attach bath mats securely with double-sided non-slip rug tape. Do not have throw rugs and other things on the floor that can make you trip. What can I do in the bedroom? Use night lights. Make sure that you have a light by your bed that is easy to reach. Do not use any sheets or blankets that are too big for your bed. They should not hang down onto the floor. Have a firm chair that has side arms. You can use this for support while you get dressed. Do not have throw rugs and other things on the floor that can make you trip. What can I do in the kitchen? Clean up any spills right away. Avoid walking on wet floors. Keep items that you use a lot in easy-to-reach places. If you need to reach something above you, use a strong step stool that has a grab bar. Keep electrical cords out of the way. Do not use floor polish  or wax that makes floors slippery. If you must use wax, use non-skid floor wax. Do not have throw rugs and other things on the floor that can make you trip. What can I do with my stairs? Do not leave any items on the stairs. Make sure that there are handrails on both sides of the stairs and use them. Fix handrails that are broken or loose. Make sure that handrails are as [place] as the stairways. Check any carpeting to make sure that it is firmly attached to the stairs. Fix any carpet that is loose or worn. Avoid having throw rugs at the top or bottom of the stairs. If you do have throw rugs, attach them to the floor with carpet tape. Make sure that you have a light switch at the top of the stairs and the bottom of the stairs. If you do not have them, ask someone to add them for you. What else can I do to help prevent falls? Wear shoes that: Do not have high heels. Have  rubber bottoms. Are comfortable and fit you well. Are closed at the toe. Do not wear sandals. If you use a stepladder: Make sure that it is fully opened. Do not climb a closed stepladder. Make sure that both sides of the stepladder are locked into place. Ask someone to hold it for you, if possible. Clearly mark and make sure that you can see: Any grab bars or handrails. First and last steps. Where the edge of each step is. Use tools that help you move around (mobility aids) if they are needed. These include: Canes. Walkers. Scooters. Crutches. Turn on the lights when you go into a dark area. Replace any light bulbs as soon as they burn out. Set up your furniture so you have a clear path. Avoid moving your furniture around. If any of your floors are uneven, fix them. If there are any pets around you, be aware of where they are. Review your medicines with your doctor. Some medicines can make you feel dizzy. This can increase your chance of falling. Ask your doctor what other things that you can do to help prevent falls. This information is not intended to replace advice given to you by your health care provider. Make sure you discuss any questions you have with your health care provider. Document Released: 06/28/2009 Document Revised: 02/07/2016 Document Reviewed: 10/06/2014 Elsevier Interactive Patient Education  2017 [employer].

## 2023-07-30 ENCOUNTER — Telehealth: Payer: Self-pay | Admitting: Family Medicine

## 2023-07-30 NOTE — Telephone Encounter (Signed)
Patient wanted it to be added to her chart that she had an additional shingles vaccine administered on 01/27/23 @ [facility]. Previous dosages were given on 08/14/16 and 01/03/20. Patient states if PCP believes anything else is needed, then tell her.

## 2023-08-20 LAB — HM MAMMOGRAPHY

## 2023-10-18 ENCOUNTER — Other Ambulatory Visit: Payer: Self-pay | Admitting: Family Medicine

## 2023-10-18 NOTE — Telephone Encounter (Signed)
Due appt in May

## 2024-01-16 ENCOUNTER — Other Ambulatory Visit: Payer: Self-pay | Admitting: Family Medicine

## 2024-02-15 ENCOUNTER — Encounter: Payer: Medicare Other | Admitting: Family Medicine

## 2024-03-23 ENCOUNTER — Ambulatory Visit: Payer: Medicare Other

## 2024-03-23 VITALS — Ht 60.0 in | Wt 132.0 lb

## 2024-03-23 DIAGNOSIS — Z0000 Encounter for general adult medical examination without abnormal findings: Secondary | ICD-10-CM

## 2024-03-23 NOTE — Patient Instructions (Signed)
 Ms. [name] Molina , Thank you for taking time out of your busy schedule to complete your Annual Wellness Visit with me. I enjoyed our conversation and look forward to speaking with you again next year. I, as well as your care team,  appreciate your ongoing commitment to your health goals. Please review the following plan we discussed and let me know if I can assist you in the future. Your Game plan/ To Do List    Referrals: If you haven't heard from the office you've been referred to, please reach out to them at the phone provided.   Follow up Visits: Next Medicare AWV with our clinical staff: 03/28/25   Have you seen your provider in the last 6 months (3 months if uncontrolled diabetes)? No Next Office Visit with your provider:  05/11/24  Clinician Recommendations:  Aim for 30 minutes of exercise or brisk walking, 6-8 glasses of water, and 5 servings of fruits and vegetables each day.        This is a list of the screening recommended for you and due dates:  Health Maintenance  Topic Date Due   COVID-19 Vaccine (1) Never done   Pneumococcal Vaccine for age over 14 (1 of 1 - PCV) Never done   Zoster (Shingles) Vaccine (2 of 2) 02/28/2020   DEXA scan (bone density measurement)  Never done   Medicare Annual Wellness Visit  03/09/2024   Flu Shot  04/15/2024   Mammogram  09/11/2024   DTaP/Tdap/Td vaccine (2 - Td or Tdap) 08/14/2026   Colon Cancer Screening  10/04/2027   Hepatitis C Screening  Completed   Hepatitis B Vaccine  Aged Out   HPV Vaccine  Aged Out   Meningitis B Vaccine  Aged Out    Advanced directives: (Declined) Advance directive discussed with you today. Even though you declined this today, please call our office should you change your mind, and we can give you the proper paperwork for you to fill out. Advance Care Planning is important because it:  [x]  Makes sure you receive the medical care that is consistent with your values, goals, and preferences  [x]  It provides guidance to  your family and loved ones and reduces their decisional burden about whether or not they are making the right decisions based on your wishes.  Follow the link provided in your after visit summary or read over the paperwork we have mailed to you to help you started getting your Advance Directives in place. If you need assistance in completing these, please reach out to us  so that we can help you!  See attachments for Preventive Care and Fall Prevention Tips.

## 2024-03-23 NOTE — Progress Notes (Signed)
 Subjective:   [name] Molina is a 68 y.o. who presents for a Medicare Wellness preventive visit.  As a reminder, Annual Wellness Visits don't include a physical exam, and some assessments may be limited, especially if this visit is performed virtually. We may recommend an in-person follow-up visit with your provider if needed.  Visit Complete: Virtual I connected with  [name] Molina on 03/23/24 by a audio enabled telemedicine application and verified that I am speaking with the correct person using two identifiers.  Patient Location: Home  Provider Location: Home Office  I discussed the limitations of evaluation and management by telemedicine. The patient expressed understanding and agreed to proceed.  Vital Signs: Because this visit was a virtual/telehealth visit, some criteria may be missing or patient reported. Any vitals not documented were not able to be obtained and vitals that have been documented are patient reported.  VideoDeclined- This patient declined Librarian, academic. Therefore the visit was completed with audio only.  Persons Participating in Visit: Patient.  AWV Questionnaire: No: Patient Medicare AWV questionnaire was not completed prior to this visit.  Cardiac Risk Factors include: advanced age (>68men, >30 women)     Objective:    Today's Vitals   03/23/24 0847  Weight: 132 lb (59.9 kg)  Height: 5' (1.524 m)   Body mass index is 25.78 kg/m.     03/23/2024    8:51 AM 03/10/2023    8:04 AM 12/13/2012    9:00 PM 12/13/2012    8:09 PM 12/12/2012   10:00 AM  Advanced Directives  Does Patient Have a Medical Advance Directive? No No Patient does not have advance directive  Patient does not have advance directive;Patient would like information  Patient does not have advance directive   Would patient like information on creating a medical advance directive? No - Patient declined No - Patient declined  Advance directive packet given     Pre-existing out of facility DNR order (yellow form or pink MOST form)   No  No  No      Data saved with a previous flowsheet row definition    Current Medications (verified) Outpatient Encounter Medications as of 03/23/2024  Medication Sig   ELDERBERRY PO Take by mouth as needed.   estradiol (VIVELLE-DOT) 0.05 MG/24HR Place 1 patch onto the skin once a week.   SUMAtriptan  (IMITREX ) 100 MG tablet Take 1 tablet by mouth as needed.   SYNTHROID  100 MCG tablet Take 1 tablet by mouth once daily   No facility-administered encounter medications on file as of 03/23/2024.    Allergies (verified) Morphine  and codeine, Other, Benadryl  [diphenhydramine  hcl], Codeine, and Diphenhydramine  hcl   History: Past Medical History:  Diagnosis Date   Allergy    Anemia    Anxiety    Cataract    RIGHT EYE,REMOVED   Depression    Headache(784.0)    migraine   Hemorrhoids 11/15/2007   Hiatal hernia    IBS (irritable bowel syndrome)    Internal hemorrhoids    Osteoporosis    Thyroid  disease    Tubular adenoma of colon    Past Surgical History:  Procedure Laterality Date   ABDOMINAL HYSTERECTOMY     CATRACTS Right    COLONOSCOPY  11/15/2007   Dr. Jakie    EYE SURGERY  22/23   INGUINAL HERNIA REPAIR     LAPAROSCOPY N/A 12/14/2012   Procedure: LAPAROSCOPY OPERATIVE;  Surgeon: LELON Tanda Mulch, MD;  Location: [facility];  Service:  Gynecology;  Laterality: N/A;  with Bilateral Ovarian Cystectomies   ovaries removed     RIGHT EYE SURGERY     RETINA WITH HOLE   TONSILLECTOMY     Family History  Problem Relation Age of Onset   Cancer Mother    Heart disease Father    Heart attack Father    Hypertension Sister    Cancer Brother    Prostate cancer Brother    Drug abuse Daughter    Heart attack Maternal Grandfather    Colon cancer Neg Hx    Esophageal cancer Neg Hx    Rectal cancer Neg Hx    Stomach cancer Neg Hx    Colon polyps Neg Hx    Crohn's disease Neg Hx    Ulcerative colitis Neg Hx     Social History   Socioeconomic History   Marital status: Married    Spouse name: Not on file   Number of children: 2   Years of education: Not on file   Highest education level: Some college, no degree  Occupational History   Not on file  Tobacco Use   Smoking status: Never    Passive exposure: Never   Smokeless tobacco: Never  Vaping Use   Vaping status: Never Used  Substance and Sexual Activity   Alcohol use: Yes    Alcohol/week: 3.0 standard drinks of alcohol    Types: 3 Glasses of wine per week    Comment: 2-3 glasses of wine through out the week   Drug use: No   Sexual activity: Not Currently    Birth control/protection: Surgical    Comment: Hysterectomy  Other Topics Concern   Not on file  Social History Narrative   Not on file   Social Drivers of Health   Financial Resource Strain: Low Risk  (03/23/2024)   Overall Financial Resource Strain (CARDIA)    Difficulty of Paying Living Expenses: Not hard at all  Food Insecurity: No Food Insecurity (03/23/2024)   Hunger Vital Sign    Worried About Running Out of Food in the Last Year: Never true    Ran Out of Food in the Last Year: Never true  Transportation Needs: No Transportation Needs (03/23/2024)   PRAPARE - Administrator, Civil Service (Medical): No    Lack of Transportation (Non-Medical): No  Physical Activity: Sufficiently Active (03/23/2024)   Exercise Vital Sign    Days of Exercise per Week: 5 days    Minutes of Exercise per Session: 60 min  Stress: No Stress Concern Present (03/23/2024)   [employer] of Occupational Health - Occupational Stress Questionnaire    Feeling of Stress: Not at all  Social Connections: Moderately Integrated (03/23/2024)   Social Connection and Isolation Panel    Frequency of Communication with Friends and Family: More than three times a week    Frequency of Social Gatherings with Friends and Family: More than three times a week    Attends Religious Services: 1 to 4  times per year    Active Member of [employer] or Organizations: No    Attends Engineer, structural: Never    Marital Status: Married    Tobacco Counseling Counseling given: Not Answered    Clinical Intake:  Pre-visit preparation completed: Yes  Pain : No/denies pain     BMI - recorded: 25.78 Nutritional Status: BMI 25 -29 Overweight Nutritional Risks: None Diabetes: No  No results found for: HGBA1C   How often do you need to  have someone help you when you read instructions, pamphlets, or other written materials from your doctor or pharmacy?: 1 - Never  Interpreter Needed?: No  Information entered by :: Ellouise Haws, LPN   Activities of Daily Living     03/23/2024    8:48 AM  In your present state of health, do you have any difficulty performing the following activities:  Hearing? 0  Vision? 0  Difficulty concentrating or making decisions? 0  Walking or climbing stairs? 0  Dressing or bathing? 0  Doing errands, shopping? 0  Preparing Food and eating ? N  Using the Toilet? N  In the past six months, have you accidently leaked urine? N  Do you have problems with loss of bowel control? N  Managing your Medications? N  Managing your Finances? N  Housekeeping or managing your Housekeeping? N    Patient Care Team: Wendolyn Jenkins Jansky, MD as PCP - General (Family Medicine)  I have updated your Care Teams any recent Medical Services you may have received from other providers in the past year.     Assessment:   This is a routine wellness examination for [place].  Hearing/Vision screen Hearing Screening - Comments:: Pt denies any hearing issues  Vision Screening - Comments:: Wears rx glasses - up to date with routine eye exams with Dr Arloa   Goals Addressed             This Visit's Progress    Patient Stated       Weight loss and keep walking        Depression Screen     03/23/2024    8:53 AM 03/10/2023    8:03 AM 09/23/2022    9:38 AM  PHQ  2/9 Scores  PHQ - 2 Score 0 0 0    Fall Risk     03/23/2024    8:54 AM 03/06/2023    8:55 AM 02/12/2023    7:59 AM 09/23/2022    9:38 AM  Fall Risk   Falls in the past year? 1 0 0 0  Number falls in past yr: 1  0 0  Injury with Fall? 1 0 0 0  Comment scraped knee     Risk for fall due to : History of fall(s) Impaired vision No Fall Risks No Fall Risks  Follow up Falls prevention discussed Falls prevention discussed Falls evaluation completed Falls evaluation completed      Data saved with a previous flowsheet row definition    MEDICARE RISK AT HOME:  Medicare Risk at Home Any stairs in or around the home?: Yes If so, are there any without handrails?: No Home free of loose throw rugs in walkways, pet beds, electrical cords, etc?: Yes Adequate lighting in your home to reduce risk of falls?: Yes Life alert?: No Use of a cane, walker or w/c?: No Grab bars in the bathroom?: No Shower chair or bench in shower?: No Elevated toilet seat or a handicapped toilet?: No  TIMED UP AND GO:  Was the test performed?  No  Cognitive Function: 6CIT completed        03/23/2024    8:56 AM 03/10/2023    8:05 AM  6CIT Screen  What Year? 0 points 0 points  What month? 0 points 0 points  What time? 0 points 0 points  Count back from 20 0 points 0 points  Months in reverse 0 points 0 points  Repeat phrase 0 points 0 points  Total Score 0  points 0 points    Immunizations Immunization History  Administered Date(s) Administered   Tdap 08/14/2016   Zoster Recombinant(Shingrix) 01/03/2020   Zoster, Live 08/14/2016   Zoster, Unspecified 08/14/2016    Screening Tests Health Maintenance  Topic Date Due   COVID-19 Vaccine (1) Never done   Pneumococcal Vaccine: 50+ Years (1 of 1 - PCV) Never done   Zoster Vaccines- Shingrix (2 of 2) 02/28/2020   DEXA SCAN  Never done   INFLUENZA VACCINE  04/15/2024   MAMMOGRAM  09/11/2024   Medicare Annual Wellness (AWV)  03/23/2025   DTaP/Tdap/Td (2 - Td  or Tdap) 08/14/2026   Colonoscopy  10/04/2027   Hepatitis C Screening  Completed   Hepatitis B Vaccines  Aged Out   HPV VACCINES  Aged Out   Meningococcal B Vaccine  Aged Out    Health Maintenance  Health Maintenance Due  Topic Date Due   COVID-19 Vaccine (1) Never done   Pneumococcal Vaccine: 50+ Years (1 of 1 - PCV) Never done   Zoster Vaccines- Shingrix (2 of 2) 02/28/2020   DEXA SCAN  Never done   Health Maintenance Items Addressed: See Nurse Notes at the end of this note  Additional Screening:  Vision Screening: Recommended annual ophthalmology exams for early detection of glaucoma and other disorders of the eye. Would you like a referral to an eye doctor? No    Dental Screening: Recommended annual dental exams for proper oral hygiene  Community Resource Referral / Chronic Care Management: CRR required this visit?  No   CCM required this visit?  No   Plan:    I have personally reviewed and noted the following in the patient's chart:   Medical and social history Use of alcohol, tobacco or illicit drugs  Current medications and supplements including opioid prescriptions. Patient is not currently taking opioid prescriptions. Functional ability and status Nutritional status Physical activity Advanced directives List of other physicians Hospitalizations, surgeries, and ER visits in previous 12 months Vitals Screenings to include cognitive, depression, and falls Referrals and appointments  In addition, I have reviewed and discussed with patient certain preventive protocols, quality metrics, and best practice recommendations. A written personalized care plan for preventive services as well as general preventive health recommendations were provided to patient.   Ellouise VEAR Haws, LPN   2/0/7974   After Visit Summary: (MyChart) Due to this being a telephonic visit, the after visit summary with patients personalized plan was offered to patient via MyChart   Notes:  records requested for mammo and [facility]

## 2024-04-06 ENCOUNTER — Encounter: Payer: Self-pay | Admitting: Family Medicine

## 2024-04-15 ENCOUNTER — Other Ambulatory Visit: Payer: Self-pay | Admitting: Family Medicine

## 2024-04-15 NOTE — Telephone Encounter (Signed)
 Patient already scheduled for CPE on 05/11/24.

## 2024-04-15 NOTE — Telephone Encounter (Signed)
 Needs appt

## 2024-04-27 ENCOUNTER — Other Ambulatory Visit: Payer: Self-pay | Admitting: Surgery

## 2024-04-27 DIAGNOSIS — R222 Localized swelling, mass and lump, trunk: Secondary | ICD-10-CM

## 2024-05-04 ENCOUNTER — Ambulatory Visit
Admission: RE | Admit: 2024-05-04 | Discharge: 2024-05-04 | Disposition: A | Discharge status: Home / Self Care | Source: Ambulatory Visit | Attending: Surgery | Admitting: Surgery

## 2024-05-04 DIAGNOSIS — R222 Localized swelling, mass and lump, trunk: Secondary | ICD-10-CM

## 2024-05-04 MED ORDER — IOPAMIDOL (ISOVUE-300) INJECTION 61%
100.0000 mL | Freq: Once | INTRAVENOUS | Status: AC | PRN
  Administered 2024-05-04: 100 mL via INTRAVENOUS

## 2024-05-11 ENCOUNTER — Encounter: Payer: Self-pay | Admitting: Family Medicine

## 2024-05-11 ENCOUNTER — Other Ambulatory Visit: Payer: Self-pay | Admitting: Family Medicine

## 2024-05-11 ENCOUNTER — Ambulatory Visit: Payer: Self-pay | Admitting: Family Medicine

## 2024-05-11 ENCOUNTER — Ambulatory Visit: Payer: Medicare Other | Admitting: Family Medicine

## 2024-05-11 VITALS — BP 127/82 | HR 67 | Temp 97.7°F | Resp 18 | Ht 60.0 in | Wt 134.0 lb

## 2024-05-11 DIAGNOSIS — E038 Other specified hypothyroidism: Secondary | ICD-10-CM

## 2024-05-11 DIAGNOSIS — Z0000 Encounter for general adult medical examination without abnormal findings: Secondary | ICD-10-CM

## 2024-05-11 DIAGNOSIS — G43009 Migraine without aura, not intractable, without status migrainosus: Secondary | ICD-10-CM

## 2024-05-11 LAB — COMPREHENSIVE METABOLIC PANEL WITH GFR
ALT: 12 U/L (ref 0–35)
AST: 17 U/L (ref 0–37)
Albumin: 4.1 g/dL (ref 3.5–5.2)
Alkaline Phosphatase: 57 U/L (ref 39–117)
BUN: 14 mg/dL (ref 6–23)
CO2: 28 meq/L (ref 19–32)
Calcium: 9 mg/dL (ref 8.4–10.5)
Chloride: 103 meq/L (ref 96–112)
Creatinine, Ser: 0.61 mg/dL (ref 0.40–1.20)
GFR: 92.14 mL/min (ref >60.00)
Glucose, Bld: 81 mg/dL (ref 70–99)
Potassium: 4.3 meq/L (ref 3.5–5.1)
Sodium: 139 meq/L (ref 135–145)
Total Bilirubin: 0.4 mg/dL (ref 0.2–1.2)
Total Protein: 6.9 g/dL (ref 6.0–8.3)

## 2024-05-11 LAB — CBC WITH DIFFERENTIAL/PLATELET
Basophils Absolute: 0 K/uL (ref 0.0–0.1)
Basophils Relative: 0.6 % (ref 0.0–3.0)
Eosinophils Absolute: 0 K/uL (ref 0.0–0.7)
Eosinophils Relative: 0.7 % (ref 0.0–5.0)
HCT: 39.4 % (ref 36.0–46.0)
Hemoglobin: 12.8 g/dL (ref 12.0–15.0)
Lymphocytes Relative: 27.9 % (ref 12.0–46.0)
Lymphs Abs: 1.4 K/uL (ref 0.7–4.0)
MCHC: 32.4 g/dL (ref 30.0–36.0)
MCV: 91.9 fl (ref 78.0–100.0)
Monocytes Absolute: 0.5 K/uL (ref 0.1–1.0)
Monocytes Relative: 11.1 % (ref 3.0–12.0)
Neutro Abs: 2.9 K/uL (ref 1.4–7.7)
Neutrophils Relative %: 59.7 % (ref 43.0–77.0)
Platelets: 193 K/uL (ref 150.0–400.0)
RBC: 4.28 Mil/uL (ref 3.87–5.11)
RDW: 14.7 % (ref 11.5–15.5)
WBC: 4.9 K/uL (ref 4.0–10.5)

## 2024-05-11 LAB — LIPID PANEL
Cholesterol: 188 mg/dL (ref 0–200)
HDL: 67.5 mg/dL (ref >39.00)
LDL Cholesterol: 106 mg/dL — ABNORMAL HIGH (ref 0–99)
NonHDL: 120.54
Total CHOL/HDL Ratio: 3
Triglycerides: 74 mg/dL (ref 0.0–149.0)
VLDL: 14.8 mg/dL (ref 0.0–40.0)

## 2024-05-11 LAB — TSH: TSH: 0.37 u[IU]/mL (ref 0.35–5.50)

## 2024-05-11 MED ORDER — SYNTHROID 100 MCG PO TABS: 100.0000 ug | ORAL_TABLET | Freq: Every day | ORAL | 3 refills | Status: AC

## 2024-05-11 NOTE — Progress Notes (Signed)
 Labs great.  I sent the 90day w/1year refill to [facility]

## 2024-05-11 NOTE — Progress Notes (Signed)
 Phone [phone]   Subjective:   Patient is a 68 y.o. female presenting for annual physical.    Chief Complaint  Patient presents with   Annual Exam    CPE Not fasting    Annual-working on exercise Discussed the use of AI scribe software for clinical note transcription with the patient, who gave verbal consent to proceed.  History of Present Illness [name] Molina is a 68 year old female with hypothyroidism who presents for a physical exam and thyroid  evaluation.  She has been taking Synthroid  100 mcg daily for her hypothyroidism. She switched from a generic thyroid  medication to Synthroid  due to significant hair loss. Despite the cost, she continues with Synthroid . No symptoms of heart racing, shakiness, jitteriness, diarrhea, or vomiting.  Throughout the summer, she has experienced nausea and bloating. She takes organic bloat pills for relief. No vomiting, but she describes a persistent bloated and 'yucky' feeling. She has tried dietary modifications, such as reducing milk intake and avoiding beef and fried foods due to gassiness and acid reflux.  She reports that Dr. Eletha identified a mass on her muscle, which led to a CT scan that showed no abnormalities. 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  She is currently on Vivelle Dot for hormone replacement and uses sumatriptan  as needed for migraines, though she has not needed it much this year.  No major headaches, dizziness, passing out, blurry or double vision, sore throat, hoarseness, trouble swallowing, chest pains, heart racing, skipping beats, coughing, wheezing, shortness of breath, diarrhea, constipation, or heartburn.  She has been feeling depressed over the past week, particularly after the loss of her dog, which she describes as a significant emotional event. No suicidal thoughts but acknowledges feeling down.  She maintains an active lifestyle, walking two to four miles a day while at the  beach and walking her dogs when possible. 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 She does not smoke, drink much, or use vaping products.     See problem oriented charting- ROS- ROS: Gen: no fever, chills  Skin: no rash, itching ENT: no ear pain, ear drainage, nasal congestion, rhinorrhea, sinus pressure, sore throat Eyes: no blurry vision, double vision Resp: no cough, wheeze,SOB CV: no CP, palpitations, LE edema,  GI: no heartburn, /v/d/c, abd pain.  bloating GU: no dysuria, urgency, frequency, hematuria MSK: no joint pain, myalgias, back pain Neuro: no dizziness, headache, weakness, vertigo Psych: no depression, anxiety, insomnia, SI   The following were reviewed and entered/updated in epic: Past Medical History:  Diagnosis Date   Allergy    Anemia    Anxiety    Cataract    RIGHT EYE,REMOVED   Depression    Headache(784.0)    migraine   Hemorrhoids 11/15/2007   Hiatal hernia    IBS (irritable bowel syndrome)    Internal hemorrhoids    Osteoporosis    Thyroid  disease    Tubular adenoma of colon    Patient Active Problem List   Diagnosis Date Noted   Other specified hypothyroidism 09/23/2022   Migraine without aura and without status migrainosus, not intractable 09/23/2022   History of colon polyps 09/23/2022   Abdominal pain, epigastric 12/05/2013   GERD (gastroesophageal reflux disease) 12/05/2013   Abnormal CT scan 12/05/2013   Abdominal pain, left lower quadrant 07/11/2013   Past Surgical History:  Procedure Laterality Date   ABDOMINAL HYSTERECTOMY     CATRACTS Right  COLONOSCOPY  11/15/2007   Dr. Jakie    EYE SURGERY  22/23   INGUINAL HERNIA REPAIR     LAPAROSCOPY N/A 12/14/2012   Procedure: LAPAROSCOPY OPERATIVE;  Surgeon: LELON Tanda Mulch, MD;  Location: [facility];  Service: Gynecology;  Laterality: N/A;  with Bilateral Ovarian Cystectomies   ovaries removed     RIGHT EYE SURGERY     RETINA WITH HOLE   TONSILLECTOMY       Family History  Problem Relation Age of Onset   Cancer Mother    Heart disease Father    Heart attack Father    Hypertension Sister    Cancer Brother    Prostate cancer Brother    Drug abuse Daughter    Heart attack Maternal Grandfather    Colon cancer Neg Hx    Esophageal cancer Neg Hx    Rectal cancer Neg Hx    Stomach cancer Neg Hx    Colon polyps Neg Hx    Crohn's disease Neg Hx    Ulcerative colitis Neg Hx     Medications- reviewed and updated Current Outpatient Medications  Medication Sig Dispense Refill   ELDERBERRY PO Take by mouth as needed.     estradiol (VIVELLE-DOT) 0.05 MG/24HR Place 1 patch onto the skin once a week.     SUMAtriptan  (IMITREX ) 100 MG tablet Take 1 tablet by mouth as needed.     SYNTHROID  100 MCG tablet Take 1 tablet by mouth once daily 30 tablet 0   No current facility-administered medications for this visit.    Allergies-reviewed and updated Allergies  Allergen Reactions   Morphine  And Codeine Nausea And Vomiting   Other Other (See Comments)    Other Reaction(s): Other  mophine   BANDAGES - HAVE CAUSED SEVERE REDNESS  mophine     BANDAGES - HAVE CAUSED SEVERE REDNESS    Other Reaction(s): Other  mophine   BANDAGES - HAVE CAUSED SEVERE REDNESS   Cetirizine Other (See Comments)   Codeine Nausea Only, Nausea And Vomiting and Other (See Comments)   Benadryl  [Diphenhydramine  Hcl] Other (See Comments)    Anxious & jittery   Diphenhydramine  Hcl Other (See Comments)    Other Reaction(s): Other (See Comments)  Anxious & jittery  Anxious & jittery    Other Reaction(s): Other (See Comments)  Anxious & jittery   Morphine  Other (See Comments)    Social History   Social History Narrative   Not on file   Objective  Objective:  BP 127/82   Pulse 67   Temp 97.7 F (36.5 C) (Temporal)   Resp 18   Ht 5' (1.524 m)   Wt 134 lb (60.8 kg)   SpO2 100%   BMI 26.17 kg/m  Physical Exam  Gen: WDWN NAD HEENT: NCAT, conjunctiva  not injected, sclera nonicteric TM WNL B, OP moist, no exudates  NECK:  supple, no thyromegaly, no nodes, no carotid bruits CARDIAC: RRR, S1S2+, no murmur. DP 2+B LUNGS: CTAB. No wheezes ABDOMEN:  BS+, soft, NTND, No HSM, + mobile mass RLQ  EXT:  no edema MSK: no gross abnormalities. MS 5/5 all 4 NEURO: A&O x3.  CN II-XII intact.  PSYCH: normal mood. Good eye contact     Assessment and Plan   Health Maintenance counseling: 1. Anticipatory guidance: Patient counseled regarding regular dental exams q6 months, eye exams,  avoiding smoking and second hand smoke, limiting alcohol to 1 beverage per day, no illicit drugs.   2. Risk factor reduction:  Advised  patient of need for regular exercise and diet rich and fruits and vegetables to reduce risk of heart attack and stroke. Exercise- encouraged.  Wt Readings from Last 3 Encounters:  05/11/24 134 lb (60.8 kg)  03/23/24 132 lb (59.9 kg)  03/10/23 141 lb (64 kg)   3. Immunizations/screenings/ancillary studies Immunization History  Administered Date(s) Administered   Tdap 08/14/2016   Zoster Recombinant(Shingrix) 01/03/2020   Zoster, Live 08/14/2016   Zoster, Unspecified 08/14/2016   Health Maintenance Due  Topic Date Due   INFLUENZA VACCINE  04/15/2024    4. Cervical cancer screening- hyst-sees gyn 5. Breast cancer screening-  mammogram due dec 6. Colon cancer screening - 10/04/27 7. Skin cancer screening- advised regular sunscreen use. Denies worrisome, changing, or new skin lesions.  8. Birth control/STD check- n/a 9. Osteoporosis screening- utd 10. Smoking associated screening - non smoker  Wellness examination  Other specified hypothyroidism -     CBC with Differential/Platelet -     Comprehensive metabolic panel with GFR -     Lipid panel -     TSH  Migraine without aura and without status migrainosus, not intractable   Annual-antic guidance Assessment and Plan Assessment & Plan Adult Wellness Visit   She attended  a routine adult wellness visit. She remains generally active, walking 2-4 miles daily while at the beach, though family responsibilities have limited her activity since returning. There are no new surgeries or significant changes in family history. She does not smoke or consume significant alcohol. Although retired, she occasionally babysits. Encourage regular physical activity, advise on a healthy diet and lifestyle modifications, and discuss the importance of wearing a seatbelt and sunscreen.  Hypothyroidism   She is on Synthroid  100 mcg with no symptoms of hyperthyroidism such as heart palpitations, jitteriness, or diarrhea. She reports the medication is expensive but necessary, having switched from a generic version due to hair loss. Thyroid  function tests have not been done in over a year. Order thyroid  function tests and refill Synthroid  for a 90-day supply after lab results.  Chronic bloating and nausea   She reports chronic bloating and nausea without vomiting. A CT scan showed no abnormalities despite a palpable mass. Her ovaries have been removed. Dietary modifications and potential food intolerances were discussed as possible causes. Recommend probiotics, advise dietary modifications including reducing wheat, starches, and beef, and consider referral to a GI specialist if symptoms persist.   General Health Maintenance   Immunizations and preventative health measures were discussed, confirming receipt of the shingles vaccine. Obtain the date of the second shingles vaccine from [facility] and receive the pneumococcal vaccine at [facility].    Recommended follow up: Return in about 1 year (around 05/11/2025) for annual physical.  Lab/Order associations:not fasting  Jenkins CHRISTELLA Carrel, MD

## 2024-05-11 NOTE — Patient Instructions (Addendum)
 It was very nice to see you today!  Try probiotics Cutting back wheat/starches.   Get date of second shingrix.  Get pneumovax 20    PLEASE NOTE:  If you had any lab tests please let us  know if you have not heard back within a few days. You may see your results on MyChart before we have a chance to review them but we will give you a call once they are reviewed by us . If we ordered any referrals today, please let us  know if you have not heard from their office within the next week.   Please try these tips to maintain a healthy lifestyle:  Eat most of your calories during the day when you are active. Eliminate processed foods including packaged sweets (pies, cakes, cookies), reduce intake of potatoes, white bread, white pasta, and white rice. Look for whole grain options, oat flour or almond flour.  Each meal should contain half fruits/vegetables, one quarter protein, and one quarter carbs (no bigger than a computer mouse).  Cut down on sweet beverages. This includes juice, soda, and sweet tea. Also watch fruit intake, though this is a healthier sweet option, it still contains natural sugar! Limit to 3 servings daily.  Drink at least 1 glass of water with each meal and aim for at least 8 glasses per day  Exercise at least 150 minutes every week.

## 2024-05-12 ENCOUNTER — Ambulatory Visit: Payer: Self-pay | Admitting: Surgery

## 2024-05-12 NOTE — Progress Notes (Signed)
 CT scan reviewed.  Report says no evidence of a mass or hernia.  However, there is visible asymmetry of the abdominal wall and some busy changes in the right groin area.  Discussed with patient.  I will order an MRI of the abdomen and pelvis with attention to the abdominal wall on the right and possible abdominal wall or inguinal hernia.  I will contact the patient when the results are available for review.  Krystal Spinner, MD [facility] practice Office: [phone]

## 2024-05-24 ENCOUNTER — Other Ambulatory Visit: Payer: Self-pay | Admitting: Surgery

## 2024-05-24 DIAGNOSIS — R222 Localized swelling, mass and lump, trunk: Secondary | ICD-10-CM

## 2024-06-01 ENCOUNTER — Ambulatory Visit
Admission: RE | Admit: 2024-06-01 | Discharge: 2024-06-01 | Disposition: A | Discharge status: Home / Self Care | Source: Ambulatory Visit | Attending: Surgery | Admitting: Surgery

## 2024-06-01 DIAGNOSIS — R222 Localized swelling, mass and lump, trunk: Secondary | ICD-10-CM

## 2024-06-01 MED ORDER — GADOPICLENOL 0.5 MMOL/ML IV SOLN
6.0000 mL | Freq: Once | INTRAVENOUS | Status: AC | PRN
  Administered 2024-06-01: 6 mL via INTRAVENOUS

## 2024-06-04 ENCOUNTER — Ambulatory Visit: Payer: Self-pay | Admitting: Surgery

## 2024-06-04 NOTE — Progress Notes (Signed)
 First of all, I see no mass in the right lower abdominal wall on this study.  There was no sign of one on the CT scan , either.  So I think we can lay that to rest.  The two small hernias containing only fat are nothing to worry about.  They are not symptomatic and not dangerous.  Only if they were to get significantly larger would we need to do anything about them, and that is unlikely.  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  I'll ask my nurse, Burnard, to coordinate an appointment with Dr. Aron or Dr. Dasie to see you in the office and review this study.  I am not certain if this will require surgery or only observation, but actually kind of lucky that it was found on this study and not later.  Burnard - please schedule office visit with Dr. Aron or Dr. Dasie regarding the pancreatic mass (possible IPMN).  Krystal Spinner, MD [facility] practice Office: [phone]

## 2024-06-08 ENCOUNTER — Encounter: Payer: Self-pay | Admitting: Surgery

## 2024-08-05 ENCOUNTER — Ambulatory Visit: Payer: Self-pay | Admitting: Gastroenterology

## 2024-08-05 ENCOUNTER — Ambulatory Visit: Admitting: Gastroenterology

## 2024-08-05 ENCOUNTER — Other Ambulatory Visit

## 2024-08-05 ENCOUNTER — Encounter: Payer: Self-pay | Admitting: Gastroenterology

## 2024-08-05 VITALS — BP 100/74 | HR 65 | Ht 60.0 in | Wt 137.0 lb

## 2024-08-05 DIAGNOSIS — K219 Gastro-esophageal reflux disease without esophagitis: Secondary | ICD-10-CM | POA: Diagnosis not present

## 2024-08-05 DIAGNOSIS — Z1211 Encounter for screening for malignant neoplasm of colon: Secondary | ICD-10-CM

## 2024-08-05 DIAGNOSIS — R1013 Epigastric pain: Secondary | ICD-10-CM

## 2024-08-05 DIAGNOSIS — R140 Abdominal distension (gaseous): Secondary | ICD-10-CM

## 2024-08-05 DIAGNOSIS — K869 Disease of pancreas, unspecified: Secondary | ICD-10-CM

## 2024-08-05 DIAGNOSIS — R110 Nausea: Secondary | ICD-10-CM

## 2024-08-05 LAB — LIPASE: Lipase: 41 U/L (ref 11.0–59.0)

## 2024-08-05 MED ORDER — PANTOPRAZOLE SODIUM 20 MG PO TBEC: 20.0000 mg | DELAYED_RELEASE_TABLET | Freq: Every day | ORAL | 3 refills | Status: AC

## 2024-08-05 NOTE — Patient Instructions (Signed)
 _______________________________________________________  If your blood pressure at your visit was 140/90 or greater, please contact your primary care physician to follow up on this.  _______________________________________________________  If you are age 68 or older, your body mass index should be between 23-30. Your Body mass index is 26.76 kg/m. If this is out of the aforementioned range listed, please consider follow up with your Primary Care Provider.  If you are age 21 or younger, your body mass index should be between 19-25. Your Body mass index is 26.76 kg/m. If this is out of the aformentioned range listed, please consider follow up with your Primary Care Provider.   ________________________________________________________  The [facility] providers would like to encourage you to use MYCHART to communicate with providers for non-urgent requests or questions.  Due to long hold times on the telephone, sending your provider a message by [facility] may be a faster and more efficient way to get a response.  Please allow 48 business hours for a response.  Please remember that this is for non-urgent requests.  _______________________________________________________  [facility] is using a team-based approach to care.  Your team is made up of your doctor and two to three APPS. Our APPS (Nurse Practitioners and Physician Assistants) work with your physician to ensure care continuity for you. They are fully qualified to address your health concerns and develop a treatment plan. They communicate directly with your gastroenterologist to care for you. Seeing the Advanced Practice Practitioners on your physician's team can help you by facilitating care more promptly, often allowing for earlier appointments, access to diagnostic testing, procedures, and other specialty referrals.   Your provider has requested that you go to the basement level for lab work before leaving today. Press B on the  elevator. The lab is located at the first door on the left as you exit the elevator.  We have sent the following medications to your pharmacy for you to pick up at your convenience:  START: pantoprazole  20mg  one tablet daily  Your provider has ordered Diatherix stool testing for you. You have received a kit from our office today containing all necessary supplies to complete this test. Please carefully read the stool collection instructions provided in the kit before opening the accompanying materials. In addition, be sure there is a label providing your full name and date of birth on the puritan opti-swab tube that is supplied in the kit (if you do not see a label with this information on your test tube, please make us  aware before test collection!). After completing the test, you should secure the purtian tube into the specimen biohazard bag. The [facility] E-Req sheet (including date and time of specimen collection) should be placed into the outside pocket of the specimen biohazard bag and returned to the [place] lab (basement floor of [facility]) within 3 days of collection. Please make sure to give the specimen to a staff member at the lab. DO NOT leave the specimen on the counter.  If the specimen date and time (can be found in the upper right boxed portion of the sheet) are not filled out on the E-Req sheet, the test will NOT be performed.   Due to recent changes in healthcare laws, you may see the results of your imaging and laboratory studies on MyChart before your provider has had a chance to review them.  We understand that in some cases there may be results that are confusing or concerning to you. Not all laboratory  results come back in the same time frame and the provider may be waiting for multiple results in order to interpret others.  Please give us  48 hours in order for your provider to thoroughly review all the results before contacting the office for  clarification of your results.

## 2024-08-05 NOTE — Progress Notes (Signed)
 Chief Complaint: Epigastric pain, bloating Primary GI MD: Dr. Albertus  HPI: [name] [place] is a 68 year old female with a past medical history as listed below including adenomatous colon polyp, hemorrhoids, IBS, small hiatal hernia and others, known Dr. Albertus.    06/17/2017 EGD and colonoscopy.  EGD normal except for small hiatal hernia.  Colonoscopy revealed normal terminal ileum.  3 mm cecal adenoma which was removed and internal hemorrhoids.    08/21/2017 patient seen by Dr. Albertus for diarrhea.   That time she had previously been tried on rifaximin 550 3 times daily x14 days.  She took it for 7 days and stopped it due to nausea and just simply did not feel well while using it.  It did not help her abdominal crampy pain and loose stools.  At that time reported episodes of lower abdominal cramping followed by urgent loose stools 2 days a week.  We discussed that this may be irritable bowel syndrome.  Dietary triggers were discussed.  Celiac disease was excluded.  Medication therapies including cholestyramine or colestipol were discussed but she preferred nonmedicinal.  Is recommended she try colestipol in the future if needed.  Surveillance colonoscopy recommended in 5 years.    01/2020: Seen by [name] Failing, PA for epigastric pressure and change in bowel habits.  CT at that time showed small hiatal hernia stable lung nodules  Seen by Dr. 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  Also sub-5 mm simple cyst in right hepatic lobe was noted.  And a small fat-containing right femoral hernia as well as tiny fat-containing right inguinal hernia.   MRI abdomen  Discussed the use of AI scribe software for  clinical note transcription with the patient, who gave verbal consent to proceed.  She experiences significant stomach problems characterized by pain, nausea, and bloating. The pain is localized to the upper abdomen, specifically at the bottom of her ribs, and sometimes worsens with eating. Despite dietary modifications, including eliminating alcohol, dairy, and fried foods, she reports only partial relief. Dairy sometimes increases her bloating and gas.  Her past medical history includes a small hiatal hernia identified during an endoscopy in 2018. Recent imaging studies, including a CT and MRI, showed a spot on the tail of the pancreas and a cyst on the liver.  She maintains an active lifestyle and is conscious of her diet, partly due to her height and the challenges of weight management. Despite her efforts, she continues to experience nausea and abdominal pain, which she describes as feeling 'yucky'.  She has a history of a lump that was initially thought to be a fatty tumor, leading to further imaging studies.    Past Medical History:  Diagnosis Date   Allergy    Anemia    Anxiety    Cataract    RIGHT EYE,REMOVED   Depression    Headache(784.0)    migraine   Hemorrhoids 11/15/2007   Hiatal hernia    IBS (irritable bowel syndrome)    Internal hemorrhoids    Osteoporosis    Thyroid  disease    Tubular adenoma of colon     Past Surgical History:  Procedure Laterality Date   ABDOMINAL HYSTERECTOMY     CATRACTS Right  COLONOSCOPY  11/15/2007   Dr. Jakie    EYE SURGERY  22/23   INGUINAL HERNIA REPAIR     LAPAROSCOPY N/A 12/14/2012   Procedure: LAPAROSCOPY OPERATIVE;  Surgeon: LELON Tanda Mulch, MD;  Location: [facility];  Service: Gynecology;  Laterality: N/A;  with Bilateral Ovarian Cystectomies   ovaries removed     RIGHT EYE SURGERY     RETINA WITH HOLE   TONSILLECTOMY      Current Outpatient Medications  Medication Sig Dispense Refill   ELDERBERRY PO Take by mouth as  needed.     estradiol (VIVELLE-DOT) 0.05 MG/24HR Place 1 patch onto the skin once a week.     pantoprazole  (PROTONIX ) 20 MG tablet Take 1 tablet (20 mg total) by mouth daily. 30 tablet 3   SUMAtriptan  (IMITREX ) 100 MG tablet Take 1 tablet by mouth as needed.     SYNTHROID  100 MCG tablet Take 1 tablet (100 mcg total) by mouth daily. 90 tablet 3   No current facility-administered medications for this visit.    Allergies as of 08/05/2024 - Review Complete 08/05/2024  Allergen Reaction Noted   Morphine  and codeine Nausea And Vomiting 01/12/2013   Other Other (See Comments) 01/12/2013   Cetirizine Other (See Comments) 10/17/2013   Codeine Nausea Only, Nausea And Vomiting, and Other (See Comments) 10/19/2012   Benadryl  [diphenhydramine  hcl] Other (See Comments) 10/19/2012   Diphenhydramine  hcl Other (See Comments) 10/19/2012   Morphine  Other (See Comments) 04/26/2024    Family History  Problem Relation Age of Onset   Cancer Mother    Heart disease Father    Heart attack Father    Hypertension Sister    Cancer Brother    Prostate cancer Brother    Drug abuse Daughter    Heart attack Maternal Grandfather    Colon cancer Neg Hx    Esophageal cancer Neg Hx    Rectal cancer Neg Hx    Stomach cancer Neg Hx    Colon polyps Neg Hx    Crohn's disease Neg Hx    Ulcerative colitis Neg Hx     Social History   Socioeconomic History   Marital status: Married    Spouse name: Not on file   Number of children: 2   Years of education: Not on file   Highest education level: Some college, no degree  Occupational History   Not on file  Tobacco Use   Smoking status: Never    Passive exposure: Never   Smokeless tobacco: Never  Vaping Use   Vaping status: Never Used  Substance and Sexual Activity   Alcohol use: Yes    Alcohol/week: 3.0 standard drinks of alcohol    Types: 3 Glasses of wine per week    Comment: 2-3 glasses of wine through out the week   Drug use: No   Sexual activity:  Not Currently    Birth control/protection: Surgical    Comment: Hysterectomy  Other Topics Concern   Not on file  Social History Narrative   Not on file   Social Drivers of Health   Financial Resource Strain: Low Risk  (03/23/2024)   Overall Financial Resource Strain (CARDIA)    Difficulty of Paying Living Expenses: Not hard at all  Food Insecurity: No Food Insecurity (03/23/2024)   Hunger Vital Sign    Worried About Running Out of Food in the Last Year: Never true    Ran Out of Food in the Last Year: Never true  Transportation Needs: No Transportation  Needs (03/23/2024)   PRAPARE - Administrator, Civil Service (Medical): No    Lack of Transportation (Non-Medical): No  Physical Activity: Sufficiently Active (03/23/2024)   Exercise Vital Sign    Days of Exercise per Week: 5 days    Minutes of Exercise per Session: 60 min  Stress: No Stress Concern Present (03/23/2024)   [employer] of Occupational Health - Occupational Stress Questionnaire    Feeling of Stress: Not at all  Social Connections: Moderately Integrated (03/23/2024)   Social Connection and Isolation Panel    Frequency of Communication with Friends and Family: More than three times a week    Frequency of Social Gatherings with Friends and Family: More than three times a week    Attends Religious Services: 1 to 4 times per year    Active Member of [employer] or Organizations: No    Attends Banker Meetings: Never    Marital Status: Married  Catering Manager Violence: Not At Risk (03/23/2024)   Humiliation, Afraid, Rape, and Kick questionnaire    Fear of Current or Ex-Partner: No    Emotionally Abused: No    Physically Abused: No    Sexually Abused: No    Review of Systems:    Constitutional: No weight loss, fever, chills, weakness or fatigue HEENT: Eyes: No change in vision               Ears, Nose, Throat:  No change in hearing or congestion Skin: No rash or itching Cardiovascular: No chest pain,  chest pressure or palpitations   Respiratory: No SOB or cough Gastrointestinal: See HPI and otherwise negative Genitourinary: No dysuria or change in urinary frequency Neurological: No headache, dizziness or syncope Musculoskeletal: No new muscle or joint pain Hematologic: No bleeding or bruising Psychiatric: No history of depression or anxiety    Physical Exam:  Vital signs: BP 100/74   Pulse 65   Ht 5' (1.524 m)   Wt 137 lb (62.1 kg)   SpO2 97%   BMI 26.76 kg/m   Constitutional: NAD, alert and cooperative Head:  Normocephalic and atraumatic. Eyes:   PEERL, EOMI. No icterus. Conjunctiva pink. Respiratory: Respirations even and unlabored. Lungs clear to auscultation bilaterally.   No wheezes, crackles, or rhonchi.  Cardiovascular:  Regular rate and rhythm. No peripheral edema, cyanosis or pallor.  Gastrointestinal:  Soft, nondistended, tender in epigastrium. No rebound or guarding. Normal bowel sounds. No appreciable masses or hepatomegaly. Rectal:  Declines Msk:  Symmetrical without gross deformities. Without edema, no deformity or joint abnormality.  Neurologic:  Alert and  oriented x4;  grossly normal neurologically.  Skin:   Dry and intact without significant lesions or rashes. Psychiatric: Oriented to person, place and time. Demonstrates good judgement and reason without abnormal affect or behaviors.  RELEVANT LABS AND IMAGING: CBC    Component Value Date/Time   WBC 4.9 05/11/2024 0948   RBC 4.28 05/11/2024 0948   HGB 12.8 05/11/2024 0948   HCT 39.4 05/11/2024 0948   PLT 193.0 05/11/2024 0948   MCV 91.9 05/11/2024 0948   MCH 29.6 09/21/2022 0915   MCHC 32.4 05/11/2024 0948   RDW 14.7 05/11/2024 0948   LYMPHSABS 1.4 05/11/2024 0948   MONOABS 0.5 05/11/2024 0948   EOSABS 0.0 05/11/2024 0948   BASOSABS 0.0 05/11/2024 0948    CMP     Component Value Date/Time   NA 139 05/11/2024 0948   K 4.3 05/11/2024 0948   CL 103 05/11/2024 0948  CO2 28 05/11/2024 0948    GLUCOSE 81 05/11/2024 0948   BUN 14 05/11/2024 0948   CREATININE 0.61 05/11/2024 0948   CALCIUM 9.0 05/11/2024 0948   PROT 6.9 05/11/2024 0948   ALBUMIN 4.1 05/11/2024 0948   AST 17 05/11/2024 0948   ALT 12 05/11/2024 0948   ALKPHOS 57 05/11/2024 0948   BILITOT 0.4 05/11/2024 0948   GFRNONAA >60 09/21/2022 0915     Assessment/Plan:   Nausea Bloating Epigastric pain History of gastric ulcers and esophagitis on EGD in 2015 with normal EGD in 2018.  Symptoms as above worse with dairy and fatty greasy foods.  Imaging with unremarkable gallbladder.  Suspect GERD.  Not on any antacid. - Trial of pantoprazole  20 mg once daily - Check lipase, TTG/IgA - Follow-up with me in 4 to 6 weeks - If no improvement on pantoprazole  consider HIDA scan to rule out biliary dyskinesia - If negative HIDA scan consider repeat EGD - FODMAP diet provided - Take Lactaid with dairy products  Abdominal wall mass Seen by Dr. Eletha in [place] 2025 for abdominal wall mass with negative CT and MRI abdomen/pelvis showing no abdominal wall mass but showing pancreatic sidebranch IPMN and, right femoral hernia and right inguinal hernia as well as sub-5 mm simple cyst in right hepatic lobe. -Referred to plastic surgery by Dr. 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

## 2024-08-06 LAB — IGA: Immunoglobulin A: 271 mg/dL (ref 70–320)

## 2024-08-06 LAB — TISSUE TRANSGLUTAMINASE, IGA: (tTG) Ab, IgA: <1 U/mL

## 2024-08-29 ENCOUNTER — Telehealth: Payer: Self-pay | Admitting: Gastroenterology

## 2024-08-29 NOTE — Telephone Encounter (Signed)
H pylori was negative.

## 2024-08-29 NOTE — Telephone Encounter (Signed)
 Patient informed of results.

## 2024-08-30 ENCOUNTER — Other Ambulatory Visit: Payer: Self-pay | Admitting: Obstetrics and Gynecology

## 2024-08-30 DIAGNOSIS — R928 Other abnormal and inconclusive findings on diagnostic imaging of breast: Secondary | ICD-10-CM

## 2024-09-02 ENCOUNTER — Other Ambulatory Visit: Payer: Self-pay | Admitting: Obstetrics and Gynecology

## 2024-09-02 ENCOUNTER — Inpatient Hospital Stay
Admission: RE | Admit: 2024-09-02 | Discharge: 2024-09-02 | Attending: Obstetrics and Gynecology | Admitting: Obstetrics and Gynecology

## 2024-09-02 ENCOUNTER — Encounter

## 2024-09-02 DIAGNOSIS — R928 Other abnormal and inconclusive findings on diagnostic imaging of breast: Secondary | ICD-10-CM

## 2024-09-02 DIAGNOSIS — N6313 Unspecified lump in the right breast, lower outer quadrant: Secondary | ICD-10-CM

## 2024-09-13 ENCOUNTER — Encounter: Payer: Self-pay | Admitting: Gastroenterology

## 2024-09-22 NOTE — Progress Notes (Unsigned)
 "  Chief Complaint: Follow-up Primary GI MD: Dr. Albertus  HPI: [name] Molina is a 69 year old female with a past medical history as listed below including adenomatous colon polyp, hemorrhoids, IBS, small hiatal hernia and others, known Dr. Albertus.    06/17/2017 EGD and colonoscopy.  EGD normal except for small hiatal hernia.  Colonoscopy revealed normal terminal ileum.  3 mm cecal adenoma which was removed and internal hemorrhoids.    08/21/2017 patient seen by Dr. Albertus for diarrhea.   That time she had previously been tried on rifaximin 550 3 times daily x14 days.  She took it for 7 days and stopped it due to nausea and just simply did not feel well while using it.  It did not help her abdominal crampy pain and loose stools.  At that time reported episodes of lower abdominal cramping followed by urgent loose stools 2 days a week.  We discussed that this may be irritable bowel syndrome.  Dietary triggers were discussed.  Celiac disease was excluded.  Medication therapies including cholestyramine or colestipol were discussed but she preferred nonmedicinal.  Is recommended she try colestipol in the future if needed.  Surveillance colonoscopy recommended in 5 years.    01/2020: Seen by Delon Failing, PA for epigastric pressure and change in bowel habits.  CT at that time showed small hiatal hernia stable lung nodules   Seen by Dr. 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  Also sub-5 mm simple cyst in right hepatic lobe was noted.  And a small fat-containing right femoral hernia as well as tiny fat-containing right inguinal hernia.   MRI abdomen  Seen by myself 08/05/2024 for nausea, bloating,  epigastric pain and abdominal wall mass.  Recommended pantoprazole  20 mg once daily and ruled out celiac with TTG/IgA   Discussed the use of AI scribe software for clinical note transcription with the patient, who gave verbal consent to proceed.  History of Present Illness      PREVIOUS GI WORKUP     Past Medical History:  Diagnosis Date   Allergy    Anemia    Anxiety    Cataract    RIGHT EYE,REMOVED   Depression    Headache(784.0)    migraine   Hemorrhoids 11/15/2007   Hiatal hernia    IBS (irritable bowel syndrome)    Internal hemorrhoids    Osteoporosis    Thyroid  disease    Tubular adenoma of colon     Past Surgical History:  Procedure Laterality Date   ABDOMINAL HYSTERECTOMY     CATRACTS Right    COLONOSCOPY  11/15/2007   Dr. Jakie    EYE SURGERY  22/23   INGUINAL HERNIA REPAIR     LAPAROSCOPY N/A 12/14/2012   Procedure: LAPAROSCOPY OPERATIVE;  Surgeon: LELON Tanda Mulch, MD;  Location: [facility];  Service: Gynecology;  Laterality: N/A;  with Bilateral Ovarian Cystectomies   ovaries removed     RIGHT EYE SURGERY     RETINA WITH HOLE   TONSILLECTOMY      Current Outpatient Medications  Medication Sig Dispense Refill   ELDERBERRY PO Take by mouth as needed.     estradiol (VIVELLE-DOT) 0.05 MG/24HR Place 1 patch onto the  skin once a week.     pantoprazole  (PROTONIX ) 20 MG tablet Take 1 tablet (20 mg total) by mouth daily. 30 tablet 3   SUMAtriptan  (IMITREX ) 100 MG tablet Take 1 tablet by mouth as needed.     SYNTHROID  100 MCG tablet Take 1 tablet (100 mcg total) by mouth daily. 90 tablet 3   No current facility-administered medications for this visit.    Allergies as of 09/23/2024 - Review Complete 08/05/2024  Allergen Reaction Noted   Morphine  and codeine Nausea And Vomiting 01/12/2013   Other Other (See Comments) 01/12/2013   Cetirizine Other (See Comments) 10/17/2013   Codeine Nausea Only, Nausea And Vomiting, and Other (See Comments) 10/19/2012    Benadryl  [diphenhydramine  hcl] Other (See Comments) 10/19/2012   Diphenhydramine  hcl Other (See Comments) 10/19/2012   Morphine  Other (See Comments) 04/26/2024    Family History  Problem Relation Age of Onset   Cancer Mother    Heart disease Father    Heart attack Father    Hypertension Sister    Cancer Brother    Prostate cancer Brother    Drug abuse Daughter    Heart attack Maternal Grandfather    Colon cancer Neg Hx    Esophageal cancer Neg Hx    Rectal cancer Neg Hx    Stomach cancer Neg Hx    Colon polyps Neg Hx    Crohn's disease Neg Hx    Ulcerative colitis Neg Hx     Social History   Socioeconomic History   Marital status: Married    Spouse name: Not on file   Number of children: 2   Years of education: Not on file   Highest education level: Some college, no degree  Occupational History   Not on file  Tobacco Use   Smoking status: Never    Passive exposure: Never   Smokeless tobacco: Never  Vaping Use   Vaping status: Never Used  Substance and Sexual Activity   Alcohol use: Yes    Alcohol/week: 3.0 standard drinks of alcohol    Types: 3 Glasses of wine per week    Comment: 2-3 glasses of wine through out the week   Drug use: No   Sexual activity: Not Currently    Birth control/protection: Surgical    Comment: Hysterectomy  Other Topics Concern   Not on file  Social History Narrative   Not on file   Social Drivers of Health   Tobacco Use: Low Risk (08/05/2024)   Patient History    Smoking Tobacco Use: Never    Smokeless Tobacco Use: Never    Passive Exposure: Never  Financial Resource Strain: Low Risk (03/23/2024)   Overall Financial Resource Strain (CARDIA)    Difficulty of Paying Living Expenses: Not hard at all  Food Insecurity: No Food Insecurity (03/23/2024)   Epic    Worried About Radiation Protection Practitioner of Food in the Last Year: Never true    Ran Out of Food in the Last Year: Never true  Transportation Needs: No Transportation Needs (03/23/2024)   Epic     Lack of Transportation (Medical): No    Lack of Transportation (Non-Medical): No  Physical Activity: Sufficiently Active (03/23/2024)   Exercise Vital Sign    Days of Exercise per Week: 5 days    Minutes of Exercise per Session: 60 min  Stress: No Stress Concern Present (03/23/2024)   [employer] of Occupational Health - Occupational Stress Questionnaire    Feeling of Stress: Not at all  Social  Connections: Moderately Integrated (03/23/2024)   Social Connection and Isolation Panel    Frequency of Communication with Friends and Family: More than three times a week    Frequency of Social Gatherings with Friends and Family: More than three times a week    Attends Religious Services: 1 to 4 times per year    Active Member of [employer] or Organizations: No    Attends Banker Meetings: Never    Marital Status: Married  Catering Manager Violence: Not At Risk (03/23/2024)   Epic    Fear of Current or Ex-Partner: No    Emotionally Abused: No    Physically Abused: No    Sexually Abused: No  Depression (PHQ2-9): Low Risk (03/23/2024)   Depression (PHQ2-9)    PHQ-2 Score: 0  Alcohol Screen: Low Risk (03/23/2024)   Alcohol Screen    Last Alcohol Screening Score (AUDIT): 1  Housing: Unknown (03/23/2024)   Epic    Unable to Pay for Housing in the Last Year: No    Number of Times Moved in the Last Year: Not on file    Homeless in the Last Year: No  Utilities: Not At Risk (03/23/2024)   Epic    Threatened with loss of utilities: No  Health Literacy: Adequate Health Literacy (03/23/2024)   B1300 Health Literacy    Frequency of need for help with medical instructions: Never    Review of Systems:    Constitutional: No weight loss, fever, chills, weakness or fatigue HEENT: Eyes: No change in vision               Ears, Nose, Throat:  No change in hearing or congestion Skin: No rash or itching Cardiovascular: No chest pain, chest pressure or palpitations   Respiratory: No SOB or  cough Gastrointestinal: See HPI and otherwise negative Genitourinary: No dysuria or change in urinary frequency Neurological: No headache, dizziness or syncope Musculoskeletal: No new muscle or joint pain Hematologic: No bleeding or bruising Psychiatric: No history of depression or anxiety    Physical Exam:  Vital signs: There were no vitals taken for this visit.  Constitutional: NAD, alert and cooperative Head:  Normocephalic and atraumatic. Eyes:   PEERL, EOMI. No icterus. Conjunctiva pink. Respiratory: Respirations even and unlabored. Lungs clear to auscultation bilaterally.   No wheezes, crackles, or rhonchi.  Cardiovascular:  Regular rate and rhythm. No peripheral edema, cyanosis or pallor.  Gastrointestinal:  Soft, nondistended, nontender. No rebound or guarding. Normal bowel sounds. No appreciable masses or hepatomegaly. Rectal:  Declines Msk:  Symmetrical without gross deformities. Without edema, no deformity or joint abnormality.  Neurologic:  Alert and  oriented x4;  grossly normal neurologically.  Skin:   Dry and intact without significant lesions or rashes. Psychiatric: Oriented to person, place and time. Demonstrates good judgement and reason without abnormal affect or behaviors.  Physical Exam    RELEVANT LABS AND IMAGING: CBC    Component Value Date/Time   WBC 4.9 05/11/2024 0948   RBC 4.28 05/11/2024 0948   HGB 12.8 05/11/2024 0948   HCT 39.4 05/11/2024 0948   PLT 193.0 05/11/2024 0948   MCV 91.9 05/11/2024 0948   MCH 29.6 09/21/2022 0915   MCHC 32.4 05/11/2024 0948   RDW 14.7 05/11/2024 0948   LYMPHSABS 1.4 05/11/2024 0948   MONOABS 0.5 05/11/2024 0948   EOSABS 0.0 05/11/2024 0948   BASOSABS 0.0 05/11/2024 0948    CMP     Component Value Date/Time   NA 139 05/11/2024 0948  K 4.3 05/11/2024 0948   CL 103 05/11/2024 0948   CO2 28 05/11/2024 0948   GLUCOSE 81 05/11/2024 0948   BUN 14 05/11/2024 0948   CREATININE 0.61 05/11/2024 0948   CALCIUM  9.0 05/11/2024 0948   PROT 6.9 05/11/2024 0948   ALBUMIN 4.1 05/11/2024 0948   AST 17 05/11/2024 0948   ALT 12 05/11/2024 0948   ALKPHOS 57 05/11/2024 0948   BILITOT 0.4 05/11/2024 0948   GFRNONAA >60 09/21/2022 0915     Assessment/Plan:   Nausea Bloating Epigastric pain History of gastric ulcers and esophagitis on EGD in 2015 with normal EGD in 2018.  Symptoms as above worse with dairy and fatty greasy foods.  Imaging with unremarkable gallbladder.  Trialed pantoprazole  20 mg once daily.  Negative celiac serologies. - Trial of pantoprazole  20 mg once daily - Check lipase, TTG/IgA - Follow-up with me in 4 to 6 weeks - If no improvement on pantoprazole  consider HIDA scan to rule out biliary dyskinesia - If negative HIDA scan consider repeat EGD - FODMAP diet provided - Take Lactaid with dairy products   Abdominal wall mass Seen by Dr. Eletha in August 2025 for abdominal wall mass with negative CT and MRI abdomen/pelvis showing no abdominal wall mass but showing pancreatic sidebranch IPMN and, right femoral hernia and right inguinal hernia as well as sub-5 mm simple cyst in right hepatic lobe. -Referred to plastic surgery by Dr. 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

## 2024-09-23 ENCOUNTER — Ambulatory Visit: Admitting: Gastroenterology

## 2024-09-26 ENCOUNTER — Ambulatory Visit: Admitting: Gastroenterology

## 2024-10-19 NOTE — Progress Notes (Unsigned)
 "    10/19/2024 AME HEAGLE [id number] 03/19/1956  Primary Gastroenterologist: Dr. Albertus    Chief Complaint:  History of Present Illness: [name] Molina is a 69 year old female with a past medical history of anxiety, depression, osteoporosis, hypothyroidism, hiatal hernia, GERD, gastric ulcers, IBS and colon polyps.   She was last seen in office by Melodi Johnita Sharper PA-C 08/05/2024 due to having nausea, abdominal pain and bloat.    Discussed the use of AI scribe software for clinical note transcription with the patient, who gave verbal consent to proceed.  History of Present Illness        Latest Ref Rng & Units 05/11/2024    9:48 AM 09/21/2022    1:35 PM 09/21/2022    9:15 AM  CBC  WBC 4.0 - 10.5 K/uL 4.9   4.5   Hemoglobin 12.0 - 15.0 g/dL 87.1  85.6  86.5   Hematocrit 36.0 - 46.0 % 39.4  42.0  41.5   Platelets 150.0 - 400.0 K/uL 193.0   196        Latest Ref Rng & Units 05/11/2024    9:48 AM 02/12/2023    8:31 AM 09/21/2022    1:35 PM  CMP  Glucose 70 - 99 mg/dL 81  79  87   BUN 6 - 23 mg/dL 14  15  9    Creatinine 0.40 - 1.20 mg/dL 9.38  9.31  9.49   Sodium 135 - 145 mEq/L 139  139  139   Potassium 3.5 - 5.1 mEq/L 4.3  4.0  3.8   Chloride 96 - 112 mEq/L 103  103  103   CO2 19 - 32 mEq/L 28  29    Calcium 8.4 - 10.5 mg/dL 9.0  9.6    Total Protein 6.0 - 8.3 g/dL 6.9  7.2    Total Bilirubin 0.2 - 1.2 mg/dL 0.4  0.5    Alkaline Phos 39 - 117 U/L 57  62    AST 0 - 37 U/L 17  17    ALT 0 - 35 U/L 12  14      Past Medical History:  Diagnosis Date   Allergy    Anemia    Anxiety    Cataract    RIGHT EYE,REMOVED   Depression    Headache(784.0)    migraine   Hemorrhoids 11/15/2007   Hiatal hernia    IBS (irritable bowel syndrome)    Internal hemorrhoids    Osteoporosis    Thyroid  disease    Tubular adenoma of colon    Past Surgical History:  Procedure Laterality Date   ABDOMINAL HYSTERECTOMY     CATRACTS Right    COLONOSCOPY  11/15/2007   Dr.  Jakie    EYE SURGERY  22/23   INGUINAL HERNIA REPAIR     LAPAROSCOPY N/A 12/14/2012   Procedure: LAPAROSCOPY OPERATIVE;  Surgeon: LELON Tanda Mulch, MD;  Location: [facility];  Service: Gynecology;  Laterality: N/A;  with Bilateral Ovarian Cystectomies   ovaries removed     RIGHT EYE SURGERY     RETINA WITH HOLE   TONSILLECTOMY       Current Medications, Allergies, Past Medical History, Past Surgical History, Family History and Social History were reviewed in [employer] record.   Review of Systems:   Constitutional: Negative for fever, sweats, chills or weight loss.  Respiratory: Negative for shortness of breath.   Cardiovascular: Negative for chest pain, palpitations and leg swelling.  Gastrointestinal: See HPI.  Musculoskeletal: Negative for back pain or muscle aches.  Neurological: Negative for dizziness, headaches or paresthesias.    Physical Exam: There were no vitals taken for this visit. General: in no acute distress. Head: Normocephalic and atraumatic. Eyes: No scleral icterus. Conjunctiva pink . Ears: Normal auditory acuity. Mouth: Dentition intact. No ulcers or lesions.  Lungs: Clear throughout to auscultation. Heart: Regular rate and rhythm, no murmur. Abdomen: Soft, nontender and nondistended. No masses or hepatomegaly. Normal bowel sounds x 4 quadrants.  Rectal: *** Musculoskeletal: Symmetrical with no gross deformities. Extremities: No edema. Neurological: Alert oriented x 4. No focal deficits.  Psychological: Alert and cooperative. 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

## 2024-10-20 ENCOUNTER — Ambulatory Visit: Admitting: Nurse Practitioner

## 2024-10-20 ENCOUNTER — Encounter: Payer: Self-pay | Admitting: Nurse Practitioner

## 2024-10-20 ENCOUNTER — Other Ambulatory Visit

## 2024-10-20 VITALS — BP 110/80 | HR 74 | Ht 60.0 in | Wt 138.0 lb

## 2024-10-20 DIAGNOSIS — R110 Nausea: Secondary | ICD-10-CM | POA: Diagnosis not present

## 2024-10-20 DIAGNOSIS — R1013 Epigastric pain: Secondary | ICD-10-CM

## 2024-10-20 DIAGNOSIS — R140 Abdominal distension (gaseous): Secondary | ICD-10-CM | POA: Diagnosis not present

## 2024-10-20 DIAGNOSIS — Z8711 Personal history of peptic ulcer disease: Secondary | ICD-10-CM

## 2024-10-20 DIAGNOSIS — K219 Gastro-esophageal reflux disease without esophagitis: Secondary | ICD-10-CM

## 2024-10-20 DIAGNOSIS — K7689 Other specified diseases of liver: Secondary | ICD-10-CM

## 2024-10-20 LAB — COMPREHENSIVE METABOLIC PANEL WITH GFR
ALT: 12 U/L (ref 3–35)
AST: 15 U/L (ref 5–37)
Albumin: 4.1 g/dL (ref 3.5–5.2)
Alkaline Phosphatase: 62 U/L (ref 39–117)
BUN: 15 mg/dL (ref 6–23)
CO2: 30 meq/L (ref 19–32)
Calcium: 9.1 mg/dL (ref 8.4–10.5)
Chloride: 104 meq/L (ref 96–112)
Creatinine, Ser: 0.61 mg/dL (ref 0.40–1.20)
GFR: 91.85 mL/min
Glucose, Bld: 88 mg/dL (ref 70–99)
Potassium: 4.2 meq/L (ref 3.5–5.1)
Sodium: 137 meq/L (ref 135–145)
Total Bilirubin: 0.4 mg/dL (ref 0.2–1.2)
Total Protein: 6.8 g/dL (ref 6.0–8.3)

## 2024-10-20 NOTE — Patient Instructions (Addendum)
 _______________________________________________________  If your blood pressure at your visit was 140/90 or greater, please contact your primary care physician to follow up on this.  _______________________________________________________  If you are age 69 or older, your body mass index should be between 23-30. Your Body mass index is 26.95 kg/m. If this is out of the aforementioned range listed, please consider follow up with your Primary Care Provider.  If you are age 26 or younger, your body mass index should be between 19-25. Your Body mass index is 26.95 kg/m. If this is out of the aformentioned range listed, please consider follow up with your Primary Care Provider.   ________________________________________________________  The [facility] providers would like to encourage you to use MYCHART to communicate with providers for non-urgent requests or questions.  Due to long hold times on the telephone, sending your provider a message by [facility] may be a faster and more efficient way to get a response.  Please allow 48 business hours for a response.  Please remember that this is for non-urgent requests.  _______________________________________________________  [facility] is using a team-based approach to care.  Your team is made up of your doctor and two to three APPS. Our APPS (Nurse Practitioners and Physician Assistants) work with your physician to ensure care continuity for you. They are fully qualified to address your health concerns and develop a treatment plan. They communicate directly with your gastroenterologist to care for you. Seeing the Advanced Practice Practitioners on your physician's team can help you by facilitating care more promptly, often allowing for earlier appointments, access to diagnostic testing, procedures, and other specialty referrals.   Your provider has requested that you go to the basement level for lab work before leaving today. Press B on the  elevator. The lab is located at the first door on the left as you exit the elevator.  Continue Protonix  20mg  daily Avoid fatty foods  Please purchase the following medications over the counter and take as directed: Lactaid 1-2 tablets with each daily product  You have been scheduled for a HIDA scan at [facility] (1st floor) on 10-28-24. Please arrive 30 minutes prior to your scheduled appointment at  [id number]. Make certain not to have anything to eat or drink at least midnight prior to your test. Should this appointment date or time not work well for you, please call radiology scheduling at [phone].  _____________________________________________________________________ hepatobiliary (HIDA) scan is an imaging procedure used to diagnose problems in the liver, gallbladder and bile ducts. In the HIDA scan, a radioactive chemical or tracer is injected into a vein in your arm. The tracer is handled by the liver like bile. Bile is a fluid produced and excreted by your liver that helps your digestive system break down fats in the foods you eat. Bile is stored in your gallbladder and the gallbladder releases the bile when you eat a meal. A special nuclear medicine scanner (gamma camera) tracks the flow of the tracer from your liver into your gallbladder and small intestine.  During your HIDA scan  You'll be asked to change into a hospital gown before your HIDA scan begins. Your health care team will position you on a table, usually on your back. The radioactive tracer is then injected into a vein in your arm.The tracer travels through your bloodstream to your liver, where it's taken up by the bile-producing cells. The radioactive tracer travels with the bile from your liver into your gallbladder and through your bile ducts to your small  intestine.You may feel some pressure while the radioactive tracer is injected into your vein. As you lie on the table, a special gamma camera is positioned over your  abdomen taking pictures of the tracer as it moves through your body. The gamma camera takes pictures continually for about an hour. You'll need to keep still during the HIDA scan. This can become uncomfortable, but you may find that you can lessen the discomfort by taking deep breaths and thinking about other things. Tell your health care team if you're uncomfortable. The radiologist will watch on a computer the progress of the radioactive tracer through your body. The HIDA scan may be stopped when the radioactive tracer is seen in the gallbladder and enters your small intestine. This typically takes about an hour. In some cases extra imaging will be performed if original images aren't satisfactory, if morphine  is given to help visualize the gallbladder or if the medication CCK is given to look at the contraction of the gallbladder. This test typically takes 2 hours to complete. ________________________________________________________________________   [name] have been scheduled for an endoscopy. Please follow written instructions given to you at your visit today.  If you use inhalers (even only as needed), please bring them with you on the day of your procedure.  If you take any of the following medications, they will need to be adjusted prior to your procedure:   DO NOT TAKE 7 DAYS PRIOR TO TEST- Trulicity (dulaglutide) Ozempic, Wegovy (semaglutide) Mounjaro, Zepbound (tirzepatide) Bydureon Bcise (exanatide extended release)  DO NOT TAKE 1 DAY PRIOR TO YOUR TEST Rybelsus (semaglutide) Adlyxin (lixisenatide) Victoza (liraglutide) Byetta (exanatide) ___________________________________________________________________________  Thank you for trusting me with your gastrointestinal care!   [name] Molina, [name] Molina

## 2024-10-21 ENCOUNTER — Ambulatory Visit: Payer: Self-pay | Admitting: Nurse Practitioner

## 2024-10-28 ENCOUNTER — Encounter (HOSPITAL_COMMUNITY)

## 2024-12-08 ENCOUNTER — Encounter: Admitting: Internal Medicine

## 2025-03-28 ENCOUNTER — Ambulatory Visit

## 2025-05-02 ENCOUNTER — Other Ambulatory Visit

## 2025-05-16 ENCOUNTER — Encounter: Admitting: Family Medicine
